# Patient Record
Sex: Female | Born: 1935
Health system: Southern US, Community
[De-identification: ages and names within clinical notes are randomized; demographics above are authoritative.]

## PROBLEM LIST (undated history)

## (undated) DIAGNOSIS — R269 Unspecified abnormalities of gait and mobility: Secondary | ICD-10-CM

## (undated) DIAGNOSIS — F0150 Vascular dementia without behavioral disturbance: Secondary | ICD-10-CM

## (undated) DIAGNOSIS — I639 Cerebral infarction, unspecified: Secondary | ICD-10-CM

## (undated) DIAGNOSIS — I619 Nontraumatic intracerebral hemorrhage, unspecified: Secondary | ICD-10-CM

## (undated) DIAGNOSIS — D110 Benign neoplasm of parotid gland: Secondary | ICD-10-CM

## (undated) DIAGNOSIS — E785 Hyperlipidemia, unspecified: Secondary | ICD-10-CM

## (undated) DIAGNOSIS — I959 Hypotension, unspecified: Secondary | ICD-10-CM

## (undated) DIAGNOSIS — I10 Essential (primary) hypertension: Secondary | ICD-10-CM

## (undated) DIAGNOSIS — Z87440 Personal history of urinary (tract) infections: Secondary | ICD-10-CM

## (undated) DIAGNOSIS — E119 Type 2 diabetes mellitus without complications: Secondary | ICD-10-CM

## (undated) DIAGNOSIS — K829 Disease of gallbladder, unspecified: Secondary | ICD-10-CM

## (undated) DIAGNOSIS — R410 Disorientation, unspecified: Secondary | ICD-10-CM

## (undated) DIAGNOSIS — R413 Other amnesia: Secondary | ICD-10-CM

## (undated) HISTORY — DX: Hyperlipidemia, unspecified: E78.5

## (undated) HISTORY — DX: Other amnesia: R41.3

## (undated) HISTORY — DX: Benign neoplasm of parotid gland: D11.0

## (undated) HISTORY — PX: TONSILLECTOMY: SUR1361

## (undated) HISTORY — DX: Disease of gallbladder, unspecified: K82.9

## (undated) HISTORY — DX: Hypotension, unspecified: I95.9

## (undated) HISTORY — DX: Personal history of urinary (tract) infections: Z87.440

## (undated) HISTORY — DX: Unspecified abnormalities of gait and mobility: R26.9

## (undated) HISTORY — DX: Vascular dementia, unspecified severity, without behavioral disturbance, psychotic disturbance, mood disturbance, and anxiety: F01.50

## (undated) HISTORY — DX: Essential (primary) hypertension: I10

## (undated) HISTORY — DX: Disorientation, unspecified: R41.0

## (undated) HISTORY — DX: Nontraumatic intracerebral hemorrhage, unspecified: I61.9

## (undated) HISTORY — DX: Type 2 diabetes mellitus without complications: E11.9

## (undated) HISTORY — DX: Cerebral infarction, unspecified: I63.9

---

## 1984-08-24 HISTORY — PX: OTHER SURGICAL HISTORY: SHX169

## 2010-08-24 HISTORY — PX: CATARACT EXTRACTION: SUR2

## 2012-03-24 DIAGNOSIS — R220 Localized swelling, mass and lump, head: Secondary | ICD-10-CM | POA: Insufficient documentation

## 2013-10-25 DIAGNOSIS — I619 Nontraumatic intracerebral hemorrhage, unspecified: Secondary | ICD-10-CM | POA: Insufficient documentation

## 2013-10-25 DIAGNOSIS — R413 Other amnesia: Secondary | ICD-10-CM | POA: Insufficient documentation

## 2013-10-25 DIAGNOSIS — F29 Unspecified psychosis not due to a substance or known physiological condition: Secondary | ICD-10-CM | POA: Insufficient documentation

## 2014-03-20 LAB — LIPID PANEL
Cholesterol: 161 mg/dL (ref 0–200)
HDL: 59 mg/dL (ref 35–70)
LDL CALC: 77 mg/dL
Triglycerides: 126 mg/dL (ref 40–160)

## 2014-03-20 LAB — CBC AND DIFFERENTIAL
HEMATOCRIT: 47 % — AB (ref 36–46)
Hemoglobin: 15.6 g/dL (ref 12.0–16.0)
Platelets: 150 10*3/uL (ref 150–399)
WBC: 8.5 10*3/mL

## 2014-03-20 LAB — HEPATIC FUNCTION PANEL
ALT: 15 U/L (ref 7–35)
AST: 15 U/L (ref 13–35)
BILIRUBIN, TOTAL: 0.5 mg/dL

## 2014-03-20 LAB — BASIC METABOLIC PANEL
CREATININE: 0.9 mg/dL (ref 0.5–1.1)
GLUCOSE: 88 mg/dL
Potassium: 4.8 mmol/L (ref 3.4–5.3)
Sodium: 143 mmol/L (ref 137–147)

## 2014-06-28 ENCOUNTER — Ambulatory Visit (INDEPENDENT_AMBULATORY_CARE_PROVIDER_SITE_OTHER): Payer: Medicare HMO | Admitting: Internal Medicine

## 2014-06-28 ENCOUNTER — Encounter: Payer: Self-pay | Admitting: Internal Medicine

## 2014-06-28 VITALS — BP 120/60 | HR 63 | Temp 98.0°F | Ht 63.78 in | Wt 110.2 lb

## 2014-06-28 DIAGNOSIS — E1140 Type 2 diabetes mellitus with diabetic neuropathy, unspecified: Secondary | ICD-10-CM

## 2014-06-28 DIAGNOSIS — Z23 Encounter for immunization: Secondary | ICD-10-CM

## 2014-06-28 DIAGNOSIS — E1143 Type 2 diabetes mellitus with diabetic autonomic (poly)neuropathy: Secondary | ICD-10-CM | POA: Insufficient documentation

## 2014-06-28 DIAGNOSIS — H9190 Unspecified hearing loss, unspecified ear: Secondary | ICD-10-CM | POA: Insufficient documentation

## 2014-06-28 DIAGNOSIS — I69391 Dysphagia following cerebral infarction: Secondary | ICD-10-CM | POA: Insufficient documentation

## 2014-06-28 DIAGNOSIS — F0150 Vascular dementia without behavioral disturbance: Secondary | ICD-10-CM

## 2014-06-28 DIAGNOSIS — E1149 Type 2 diabetes mellitus with other diabetic neurological complication: Secondary | ICD-10-CM | POA: Insufficient documentation

## 2014-06-28 DIAGNOSIS — I951 Orthostatic hypotension: Secondary | ICD-10-CM | POA: Insufficient documentation

## 2014-06-28 DIAGNOSIS — S0452XD Injury of facial nerve, left side, subsequent encounter: Secondary | ICD-10-CM

## 2014-06-28 DIAGNOSIS — K819 Cholecystitis, unspecified: Secondary | ICD-10-CM | POA: Insufficient documentation

## 2014-06-28 DIAGNOSIS — H9193 Unspecified hearing loss, bilateral: Secondary | ICD-10-CM

## 2014-06-28 DIAGNOSIS — E785 Hyperlipidemia, unspecified: Secondary | ICD-10-CM | POA: Insufficient documentation

## 2014-06-28 DIAGNOSIS — R197 Diarrhea, unspecified: Secondary | ICD-10-CM | POA: Insufficient documentation

## 2014-06-28 DIAGNOSIS — S0452XA Injury of facial nerve, left side, initial encounter: Secondary | ICD-10-CM | POA: Insufficient documentation

## 2014-06-28 NOTE — Patient Instructions (Signed)
Please bring [place] a copy of her living will and hcpoa.

## 2014-06-28 NOTE — Progress Notes (Signed)
Patient ID: Erin Hubbard, female   DOB: 03/02/1925, 80 y.o.   MRN: [id number]   Location:  [facility]  Code Status: reviewed that she has living will and hcpoa and will bring copies  Allergies  Allergen Reactions  . Penicillins     Chief Complaint  Patient presents with  . Establish Care    New patient    HPI: Patient is a 80 y.o. female seen in the office today to establish with our practice.    Erin Hubbard herself has no concerns. PCP left [facility].  Her granddaughter was looking for someone who specialized in geriatrics.    Had one stroke sometime before 2003.    Has had memory problems since stroke 2013 when off plavix for parotid tumor removal.  Facial drooping is from removal of the parotid damaging her facial nerve.  Dr. Mamie Hubbard at [facility] had seen her with second stroke.  EEG later.  Had neuropsych testing.  Home health was coming in and out working with PT, OT, ST--advance home care.  Has not had therapy lately.  No more therapy has been ordered yet.    A month after the stroke, she had troubles with her gallbladder--diet got restricted.  Seems like they've found an even diet.  Cheese caused bad diarrhea.  Does skim milk and yogurt.  1/2 [place] sandwich between meals.  Trouble keeping glucose up in 100s.  Using diabetic plate method.  Has been to dietitian also.  Eats very well.  Did lose weight--130-140 at start, now trying to keep her at 100.  Today was 110.    No pain.  No falls.  Uses cane outside.  Less steady on feet for past month.  Has bunions and her shoes are uncomfortable.  Has never had diabetic shoes fitted.  Likes her pretty shoes for church.  Sings in the choir there.  Sister plays the organ and pt plays piano.  Has picked it back up--practices a lot.  Does crossword puzzle in paper daily.    Is HOH.    No hallucinations  Sees Erin Hubbard for ophtho.  Cranberry supplement has been helping prevent recurrent utis--none in a year.   Encouraging fluids also.  Avoiding a lot of high sugar foods.  They were considering taking her off zetia b/c lipids looked good.    Has not been on anything but the 5mg  of aricept.  B/c of her stomach, they avoided adding other agents.  Have some trouble keeping her well hydrated.  Uses the aricept at night.  Does sleep a lot in the daytime.  Goes to bed at 10pm, reads, gets up at 5am.    Was having a swallowing issue at one point.  Had swallowing study.  Some thickening of esophageal wall --thought to be age related per pt's son.  Still has to clear throat a little after eating.    Doesn't get a lot of activity.  More likely to drop when she is active.    Does have some neuropathy in her feet.  Both feet.  Has bunions.  Also has some numbness of left hand and lips.    Review of Systems:  Review of Systems  Constitutional: Negative for fever.  HENT: Positive for hearing loss. Negative for congestion.   Eyes: Negative for blurred vision.  Respiratory: Negative for shortness of breath.   Cardiovascular: Negative for chest pain, palpitations and leg swelling.  Gastrointestinal: Negative for abdominal pain.  Skin: Negative for itching  and rash.  Neurological: Negative for headaches.    Past Medical History  Diagnosis Date  . Hyperlipidemia   . Diabetes mellitus without complication   . Hypotension   . Stroke   . Dementia   . History of recurrent UTI (urinary tract infection)     Past Surgical History  Procedure Laterality Date  . Tonsillectomy      childhood  . Cataract extraction  2012  . Removal partoid tumor  1986    Social History:   reports that she has never smoked. She does not have any smokeless tobacco history on file. She reports that she does not drink alcohol or use illicit drugs.  Family History  Problem Relation Age of Onset  . Heart disease Mother   . Heart disease Father     Medications: Patient's Medications  New Prescriptions   No medications on  file  Previous Medications   ARTIFICIAL TEAR OINTMENT (LUBRICANT EYE) OINT    Apply to eye at bedtime.   CLOPIDOGREL (PLAVIX) 75 MG TABLET    Take 75 mg by mouth daily. 1   CRANBERRY-VIT C-LACTOBACILLUS (RA CRANBERRY SUPPLEMENTS PO)    Take by mouth.   DONEPEZIL (ARICEPT) 5 MG TABLET    Take 5 mg by mouth at bedtime.   EZETIMIBE (ZETIA) 10 MG TABLET    Take 10 mg by mouth daily. 1 tabltet every night at bed time   GLUCOSE BLOOD TEST STRIP    1 each by Other route as needed for other. Use as instructed   PANTOPRAZOLE (PROTONIX) 40 MG TABLET    Take 40 mg by mouth daily. 1 tab by mouth 2 times a day with food   POLYETHYL GLYCOL-PROPYL GLYCOL 0.4-0.3 % SOLN    Apply to eye.  Modified Medications   No medications on file  Discontinued Medications   No medications on file     Physical Exam: Filed Vitals:   06/28/14 0855  BP: 120/60  Pulse: 63  Temp: 98 F (36.7 C)  TempSrc: Oral  Height: 5' 3.78" (1.62 m)  Weight: 110 lb 3.2 oz (49.986 kg)  SpO2: 99%  Physical Exam  Constitutional: She appears well-developed and well-nourished. No distress.  HENT:  Right Ear: External ear normal.  Left Ear: External ear normal.  Nose: Nose normal.  Mouth/Throat: Oropharynx is clear and moist.  Left ear canal with increased cerumen and difficult to navigate canal;  Left facial droop  Eyes: Conjunctivae and EOM are normal. Pupils are equal, round, and reactive to light.  Neck: Normal range of motion. Neck supple. No JVD present. No thyromegaly present.  Cardiovascular: Normal rate, regular rhythm, normal heart sounds and intact distal pulses.   Pulmonary/Chest: Effort normal and breath sounds normal. No respiratory distress.  Abdominal: Soft. Bowel sounds are normal. She exhibits no distension and no mass. There is no tenderness.  Musculoskeletal: Normal range of motion.  Ambulates with cane  Lymphadenopathy:    She has no cervical adenopathy.  Neurological: She is alert. She has normal  reflexes.  Oriented to person and place, not precise time, provides fairly good history with help of her son and granddaughter; left facial droop due to facial nerve damage, drooping includes her left eye  Skin: Skin is warm and dry. There is pallor.  Psychiatric: She has a normal mood and affect. Her behavior is normal.    Labs reviewed: Need to abstract most recent from records from [place]   Assessment/Plan 1. Vascular dementia without behavioral disturbance -  is doing quite well on aricept 5mg  alone--may consider adding namenda or switching to namzaric at a future visit, but they are hesitant due to her sensitive stomach -check mmse at her annual exam next visit  2. Injury of left facial nerve, subsequent encounter -has chronic left facial droop  3. Dysphagia as late effect of stroke -has some difficulty with clearing mucus after meals -previously did have swallow study--need to find in records  4. Hyperlipidemia -cont zetia for now--need an updated FLP  5. Type 2 diabetes mellitus with neurological manifestations, controlled -has been well controlled due to dietary changes, but having some lows -will send to a dietitian to see what advice they have about her snacks -also has gallbladder disease that restricts her diet--addition of cheese caused severe diarrhea  6. Diabetic autonomic neuropathy associated with type 2 diabetes mellitus -seems the most likely etiology--in both feet;  Also has some chronic neuropathic changes on left side due to her first stroke  7. Orthostatic hypotension -previously noted, no recent problems with lightheadedness on standing  8. Gall bladder inflammation -on low fat diet to avoid diarrhea -again, refer to dietitian to help more with this and difficulties she is having with hypoglycemia  9. Diarrhea -due to #8, well controlled when adherent with low fat diet  10. Need for influenza vaccination -flu shot given  Labs/tests ordered:     Orders Placed This Encounter  Procedures  . CBC With differential/Platelet  . Comprehensive metabolic panel  . Hemoglobin A1c  . B12 and Folate Panel  . Ambulatory referral to Podiatry    Referral Priority:  Routine    Referral Type:  Consultation    Referral Reason:  Specialty Services Required    Requested Specialty:  Podiatry    Number of Visits Requested:  1  . Amb ref to Medical Nutrition Therapy-MNT    Referral Priority:  Routine    Referral Type:  Consultation    Referral Reason:  Specialty Services Required    Requested Specialty:  Nutrition    Number of Visits Requested:  1    Next appt:  3 mos for annual exam with mmse and prevnar  Ellamay Fors L. Tatianna Ibbotson, D.O. [facility] [address], [place] [address] Cell Phone (Mon-Fri 8am-5pm):  [phone] On Call:  [phone] & follow prompts after 5pm & weekends Office Phone:  [phone] Office Fax:  [phone]

## 2014-06-29 LAB — CBC WITH DIFFERENTIAL
Basophils Absolute: 0.1 10*3/uL (ref 0.0–0.2)
Basos: 1 %
Eos: 7 %
Eosinophils Absolute: 0.6 10*3/uL — ABNORMAL HIGH (ref 0.0–0.4)
HCT: 46.1 % (ref 34.0–46.6)
Hemoglobin: 15.4 g/dL (ref 11.1–15.9)
Immature Grans (Abs): 0 10*3/uL (ref 0.0–0.1)
Immature Granulocytes: 0 %
Lymphocytes Absolute: 2.2 10*3/uL (ref 0.7–3.1)
Lymphs: 25 %
MCH: 30.6 pg (ref 26.6–33.0)
MCHC: 33.4 g/dL (ref 31.5–35.7)
MCV: 92 fL (ref 79–97)
Monocytes Absolute: 1 10*3/uL — ABNORMAL HIGH (ref 0.1–0.9)
Monocytes: 12 %
Neutrophils Absolute: 4.8 10*3/uL (ref 1.4–7.0)
Neutrophils Relative %: 55 %
Platelets: 179 10*3/uL (ref 150–379)
RBC: 5.04 x10E6/uL (ref 3.77–5.28)
RDW: 12.9 % (ref 12.3–15.4)
WBC: 8.6 10*3/uL (ref 3.4–10.8)

## 2014-06-29 LAB — COMPREHENSIVE METABOLIC PANEL
ALT: 8 IU/L (ref 0–32)
AST: 13 IU/L (ref 0–40)
Albumin/Globulin Ratio: 1.5 (ref 1.1–2.5)
Albumin: 4 g/dL (ref 3.5–4.7)
Alkaline Phosphatase: 77 IU/L (ref 39–117)
BUN/Creatinine Ratio: 36 — ABNORMAL HIGH (ref 11–26)
BUN: 42 mg/dL — ABNORMAL HIGH (ref 8–27)
CO2: 25 mmol/L (ref 18–29)
Calcium: 9 mg/dL (ref 8.7–10.3)
Chloride: 101 mmol/L (ref 97–108)
Creatinine, Ser: 1.16 mg/dL — ABNORMAL HIGH (ref 0.57–1.00)
GFR calc Af Amer: 49 mL/min/{1.73_m2} — ABNORMAL LOW (ref >59)
GFR calc non Af Amer: 43 mL/min/{1.73_m2} — ABNORMAL LOW (ref >59)
Globulin, Total: 2.7 g/dL (ref 1.5–4.5)
Glucose: 85 mg/dL (ref 65–99)
Potassium: 4.5 mmol/L (ref 3.5–5.2)
Sodium: 140 mmol/L (ref 134–144)
Total Bilirubin: 0.5 mg/dL (ref 0.0–1.2)
Total Protein: 6.7 g/dL (ref 6.0–8.5)

## 2014-06-29 LAB — B12 AND FOLATE PANEL
Folate: 17 ng/mL (ref >3.0)
Vitamin B-12: 1302 pg/mL — ABNORMAL HIGH (ref 211–946)

## 2014-06-29 LAB — HEMOGLOBIN A1C
Est. average glucose Bld gHb Est-mCnc: 131 mg/dL
Hgb A1c MFr Bld: 6.2 % — ABNORMAL HIGH (ref 4.8–5.6)

## 2014-07-02 ENCOUNTER — Telehealth: Payer: Self-pay | Admitting: *Deleted

## 2014-07-02 NOTE — Telephone Encounter (Signed)
-----   Message from Gayland Curry, DO sent at 06/30/2014 12:25 PM EST ----- Her sugar average is in the prediabetic range.  At her age, anything under 8 is excellent.  She can eat more liberally in terms of carbs and sugars.  She is restricted by her gallbladder problems in terms of fatty foods causing her diarrhea.   Her B12 and folate are ok.  In fact, she's getting plenty of b12.   Her blood counts are normal.   She has mildly decreased kidney function which is not abnormal in her age group.  She should avoid using nsaids like aleve, motrin.

## 2014-07-02 NOTE — Telephone Encounter (Signed)
Called pt, explained labs and provider instructions, pt. Understood well.

## 2014-08-09 ENCOUNTER — Ambulatory Visit: Payer: Self-pay | Admitting: *Deleted

## 2014-08-14 ENCOUNTER — Encounter: Payer: Self-pay | Admitting: *Deleted

## 2014-08-28 ENCOUNTER — Ambulatory Visit: Payer: Self-pay | Admitting: *Deleted

## 2014-09-24 ENCOUNTER — Encounter: Payer: Commercial Managed Care - HMO | Attending: Internal Medicine | Admitting: Dietician

## 2014-09-24 ENCOUNTER — Encounter: Payer: Self-pay | Admitting: Dietician

## 2014-09-24 VITALS — Ht 64.0 in | Wt 112.0 lb

## 2014-09-24 DIAGNOSIS — E1140 Type 2 diabetes mellitus with diabetic neuropathy, unspecified: Secondary | ICD-10-CM | POA: Diagnosis present

## 2014-09-24 DIAGNOSIS — Z713 Dietary counseling and surveillance: Secondary | ICD-10-CM | POA: Insufficient documentation

## 2014-09-24 DIAGNOSIS — E1149 Type 2 diabetes mellitus with other diabetic neurological complication: Secondary | ICD-10-CM

## 2014-09-24 NOTE — Progress Notes (Signed)
  Medical Nutrition Therapy:  Appt start time: 1130 end time:  1230.   Assessment:  Primary concerns today: Patient is here with her granddaughter who helps care for her.  They are concerned that patient's blood sugar is getting too low.  Patient has a hx of DM with last HgbA1C of 6.2% 06/28/14.  She has been having gall bladder issues as well ann cannot tolerate fats, acidic, or spicy foods.  She lives with her daughter and granddaughter who care for her during the week and spends time with her sister on the weekend.   Meals are very balanced and usually eat 3 meals and 3 snacks daily.  Her diet is low fat, low sodium, low acid and spicy foods, limited sugar.  She does not tolerate sugar substitutes other than stevia.  UBW 130-140 lbs at the time of her stroke 3-4 years ago.  Weight is now 112 lbs but has been stable for the past 1 to 1 1/2 years.  She does not drink enough fluids and has problems maintaining her blood pressure at times.  When she eats fats and spicy foods she gets diarrhea which causes dehydration quickly.    Nutrition Focused physical exam completed which was within normal limits.  Preferred Learning Style:   No preference indicated   Learning Readiness:   Change in progress   MEDICATIONS: see list.  None for blood sugar control   DIETARY INTAKE:  24-hr recall:  B (6:30-6:40AM): 3 egg whites, 1 cup cheerios with skim milk, lite activia yogurt  Snk (9AM): crackers and lite vanilla yogurt or 1/2 LS [place] sandwich  L (12:00 PM): 1/2 plate of vegetables, 3 oz meat, 1/4 plate of starch and fruit Snk (2:30 PM): crackers and lite vanilla yogurt or 1/2 LS [place] sandwich D (5 PM): same as dinner but no fruit Snk (9 PM): lite yogurt Beverages: water, gingerale occasional (mostly to treat low blood sugar), sweet tea occasionally  Usual physical activity: uses a cane.  Hx of a stroke.  Estimated energy needs: 2000 calories 225 g carbohydrates 150 g protein <56 g  fat  Progress Towards Goal(s):  In progress.   Nutritional Diagnosis:  NB-1.1 Food and nutrition-related knowledge deficit As related to treatment and prevention of low blood sugar.  As evidenced by report.    Intervention:  Nutrition counseling.  Teaching Method Utilized:  Visual Auditory Hands on  Handouts given during visit include:  Meal Plan Card  Low CHO/protein snacks  Label reading  Barriers to learning/adherence to lifestyle change: none  Demonstrated degree of understanding via:  Teach Back   Monitoring/Evaluation:  Dietary intake, exercise, label reading, and body weight prn.

## 2014-09-24 NOTE — Patient Instructions (Addendum)
Use the rule of 15 to treat low blood sugars. (give 15 grams carbs then test BS in 15 minutes, if still low repeat) Aim for 3-4 Carb choices at eat meal. (4 choices at breakfast) Add variety with snack choice options provided.

## 2014-09-25 ENCOUNTER — Other Ambulatory Visit: Payer: Self-pay

## 2014-09-25 MED ORDER — PANTOPRAZOLE SODIUM 40 MG PO TBEC: DELAYED_RELEASE_TABLET | ORAL | Status: DC

## 2014-09-25 MED ORDER — GLUCOSE BLOOD VI STRP: ORAL_STRIP | Status: DC

## 2014-09-25 MED ORDER — CLOPIDOGREL BISULFATE 75 MG PO TABS: 75.0000 mg | ORAL_TABLET | Freq: Every day | ORAL | Status: DC

## 2014-10-18 ENCOUNTER — Telehealth: Payer: Self-pay

## 2014-10-18 NOTE — Telephone Encounter (Signed)
Patient with cold symptoms since Monday. Runny nose, cough, congestion and slight fever.  Patient tried OTC medications with no relief. Scheduled appointment for tomorrow at 10:45 am with Dr.Reed

## 2014-10-19 ENCOUNTER — Encounter: Payer: Self-pay | Admitting: Internal Medicine

## 2014-10-19 ENCOUNTER — Ambulatory Visit (INDEPENDENT_AMBULATORY_CARE_PROVIDER_SITE_OTHER): Payer: Medicare HMO | Admitting: Internal Medicine

## 2014-10-19 VITALS — BP 110/60 | HR 56 | Temp 98.2°F | Resp 12 | Ht 64.0 in | Wt 112.6 lb

## 2014-10-19 DIAGNOSIS — J069 Acute upper respiratory infection, unspecified: Secondary | ICD-10-CM

## 2014-10-19 DIAGNOSIS — B9789 Other viral agents as the cause of diseases classified elsewhere: Principal | ICD-10-CM

## 2014-10-19 DIAGNOSIS — S0452XD Injury of facial nerve, left side, subsequent encounter: Secondary | ICD-10-CM | POA: Diagnosis not present

## 2014-10-19 NOTE — Progress Notes (Signed)
Patient ID: Erin Hubbard, female   DOB: [birth year], 81 y.o.   MRN: [id number]   Location:  [facility] / [place]  Advanced Directive information Does patient have an advance directive?: Yes, Type of Advance Directive: Healthcare Power of [place];Living will, Does patient want to make changes to advanced directive?: No - Patient declined   Allergies  Allergen Reactions  . Penicillins     Chief Complaint  Patient presents with  . Acute Visit    Cold symptoms since last weekend. Symptoms: Fever (subsided), congestion, cough, weakness, and bloody nasal drainage    HPI: Patient is a 81 y.o. white female seen in the office today for an acute visit as above.    Still has runny nose.  Has thick mucus.  Some blood in tissues now, trouble getting the mucus up.  Throat had initially been scratchy.  Left eye that does not close well on its own is more irritated.  She is supposed to be putting some ointment in there and has not.    Went to the nutritionist.  Sugars went down, but more carb made her bottom out first thing in the am.  Hard to get enough protein in the morning.  They were making dietary changes the week before the cold started.  She has been out to church to granddaughter's ot and pt.    Review of Systems:  Review of Systems  Constitutional: Positive for fever and chills.  HENT: Positive for congestion, nosebleeds and sore throat. Negative for ear discharge and ear pain.   Eyes: Positive for discharge and redness.       Left eye; has drooping lid  Respiratory: Positive for cough and sputum production. Negative for shortness of breath.   Cardiovascular: Negative for chest pain.  Gastrointestinal: Negative for nausea, vomiting, abdominal pain, diarrhea and constipation.  Musculoskeletal: Negative for falls.  Neurological: Negative for headaches.       Facial droop  Psychiatric/Behavioral: Positive for memory loss.     Past Medical History    Diagnosis Date  . Hyperlipidemia   . Diabetes mellitus without complication   . Hypotension   . Stroke   . Vascular dementia without behavioral disturbance   . History of recurrent UTI (urinary tract infection)   . Benign parotid tumor 1986-1987    s/p resection x 2  . Gallbladder disorder     Past Surgical History  Procedure Laterality Date  . Tonsillectomy      childhood  . Cataract extraction  2012    Dr. Bing Plume  . Removal partoid tumor  1986    Social History:   reports that she has never smoked. She does not have any smokeless tobacco history on file. She reports that she does not drink alcohol or use illicit drugs.  Family History  Problem Relation Age of Onset  . Heart disease Mother   . Heart disease Father   . High blood pressure Sister   . High Cholesterol Sister   . Heart disease Sister   . Polymyositis Daughter   . Diabetes Daughter   . Stroke Other     aunt    Medications: Patient's Medications  New Prescriptions   No medications on file  Previous Medications   ARTIFICIAL TEAR OINTMENT (LUBRICANT EYE) OINT    Apply to eye at bedtime.   CLOPIDOGREL (PLAVIX) 75 MG TABLET    Take 1 tablet (75 mg total) by mouth daily. 1   CRANBERRY-VIT C-LACTOBACILLUS (RA CRANBERRY  SUPPLEMENTS PO)    Take by mouth.   DONEPEZIL (ARICEPT) 5 MG TABLET    Take 5 mg by mouth at bedtime.   EZETIMIBE (ZETIA) 10 MG TABLET    Take 10 mg by mouth daily. 1 tabltet every night at bed time   GLUCOSE BLOOD TEST STRIP    OneTouch Ultra 2 Check blood sugar 3-4 times daily as directed DX E11.40   PANTOPRAZOLE (PROTONIX) 40 MG TABLET    1 tab by mouth 2 times a day with food   POLYETHYL GLYCOL-PROPYL GLYCOL 0.4-0.3 % SOLN    Apply to eye.  Modified Medications   No medications on file  Discontinued Medications   No medications on file     Physical Exam: Filed Vitals:   10/19/14 1030  BP: 110/60  Pulse: 56  Temp: 98.2 F (36.8 C)  TempSrc: Oral  Resp: 12  Height: 5\' 4"  (1.626  m)  Weight: 112 lb 9.6 oz (51.075 kg)  SpO2: 96%  Physical Exam  Constitutional: No distress.  Frail white female  HENT:  Right Ear: External ear normal.  Left Ear: External ear normal.  Nose: Nose normal.  Mouth/Throat: Oropharynx is clear and moist. No oropharyngeal exudate.  Eyes: Pupils are equal, round, and reactive to light.  Left eye droop with some erythema of conjunctiva  Neck: Neck supple.  Cardiovascular: Normal rate, regular rhythm and normal heart sounds.   Pulmonary/Chest: Effort normal and breath sounds normal. No respiratory distress. She has no wheezes. She has no rales.  No rhonchi  Abdominal: Soft. Bowel sounds are normal. She exhibits no distension and no mass. There is no tenderness.  Lymphadenopathy:    She has no cervical adenopathy.  Neurological:  Left facial droop, uses cane  Skin: Skin is warm and dry. There is pallor.    Labs reviewed: Basic Metabolic Panel:  Recent Labs  03/20/14 06/28/14 1043  NA 143 140  K 4.8 4.5  CL  --  101  CO2  --  25  GLUCOSE  --  85  BUN  --  42*  CREATININE 0.9 1.16*  CALCIUM  --  9.0   Liver Function Tests:  Recent Labs  03/20/14 06/28/14 1043  AST 15 13  ALT 15 8  ALKPHOS  --  77  BILITOT  --  0.5  PROT  --  6.7   No results for input(s): LIPASE, AMYLASE in the last 8760 hours. No results for input(s): AMMONIA in the last 8760 hours. CBC:  Recent Labs  03/20/14 06/28/14 1043  WBC 8.5 8.6  NEUTROABS  --  4.8  HGB 15.6 15.4  HCT 47* 46.1  MCV  --  92  PLT 150 179   Lipid Panel:  Recent Labs  03/20/14  CHOL 161  HDL 59  LDLCALC 77  TRIG 126   Lab Results  Component Value Date   HGBA1C 6.2* 06/28/2014    Assessment/Plan 1. Viral upper respiratory tract infection with cough -given samples for mucinex 12 hour to be used once each morning (expectorant) -given samples for delsym cough syrup for night time to suppress cough overnight -advised to drink plenty of water and get a lot of  rest -use humidity   2. Injury of left facial nerve, subsequent encounter -encouraged to use ointment and eye drops to help   Labs/tests ordered:  None today Next appt:  Keep in April as scheduled  Erin Hubbard L. Johnryan Sao, D.O. [facility] [phone]  Erin Hubbard, Erin Hubbard [address] Cell Phone (Mon-Fri 8am-5pm):  [phone] On Call:  [phone] & follow prompts after 5pm & weekends Office Phone:  [phone] Office Fax:  [phone]

## 2014-10-19 NOTE — Patient Instructions (Addendum)
Delsym at night Mucinex in the morning Drink plenty of fluids and use cool mist humidity  Upper Respiratory Infection, Adult An upper respiratory infection (URI) is also sometimes known as the common cold. The upper respiratory tract includes the nose, sinuses, throat, trachea, and bronchi. Bronchi are the airways leading to the lungs. Most people improve within 1 week, but symptoms can last up to 2 weeks. A residual cough may last even longer.  CAUSES Many different viruses can infect the tissues lining the upper respiratory tract. The tissues become irritated and inflamed and often become very moist. Mucus production is also common. A cold is contagious. You can easily spread the virus to others by oral contact. This includes kissing, sharing a glass, coughing, or sneezing. Touching your mouth or nose and then touching a surface, which is then touched by another person, can also spread the virus. SYMPTOMS  Symptoms typically develop 1 to 3 days after you come in contact with a cold virus. Symptoms vary from person to person. They may include:  Runny nose.  Sneezing.  Nasal congestion.  Sinus irritation.  Sore throat.  Loss of voice (laryngitis).  Cough.  Fatigue.  Muscle aches.  Loss of appetite.  Headache.  Low-grade fever. DIAGNOSIS  You might diagnose your own cold based on familiar symptoms, since most people get a cold 2 to 3 times a year. Your caregiver can confirm this based on your exam. Most importantly, your caregiver can check that your symptoms are not due to another disease such as strep throat, sinusitis, pneumonia, asthma, or epiglottitis. Blood tests, throat tests, and X-rays are not necessary to diagnose a common cold, but they may sometimes be helpful in excluding other more serious diseases. Your caregiver will decide if any further tests are required. RISKS AND COMPLICATIONS  You may be at risk for a more severe case of the common cold if you smoke  cigarettes, have chronic heart disease (such as heart failure) or lung disease (such as asthma), or if you have a weakened immune system. The very young and very old are also at risk for more serious infections. Bacterial sinusitis, middle ear infections, and bacterial pneumonia can complicate the common cold. The common cold can worsen asthma and chronic obstructive pulmonary disease (COPD). Sometimes, these complications can require emergency medical care and may be life-threatening. PREVENTION  The best way to protect against getting a cold is to practice good hygiene. Avoid oral or hand contact with people with cold symptoms. Wash your hands often if contact occurs. There is no clear evidence that vitamin C, vitamin E, echinacea, or exercise reduces the chance of developing a cold. However, it is always recommended to get plenty of rest and practice good nutrition. TREATMENT  Treatment is directed at relieving symptoms. There is no cure. Antibiotics are not effective, because the infection is caused by a virus, not by bacteria. Treatment may include:  Increased fluid intake. Sports drinks offer valuable electrolytes, sugars, and fluids.  Breathing heated mist or steam (vaporizer or shower).  Eating chicken soup or other clear broths, and maintaining good nutrition.  Getting plenty of rest.  Using gargles or lozenges for comfort.  Controlling fevers with ibuprofen or acetaminophen as directed by your caregiver.  Increasing usage of your inhaler if you have asthma. Zinc gel and zinc lozenges, taken in the first 24 hours of the common cold, can shorten the duration and lessen the severity of symptoms. Pain medicines may help with fever, muscle aches, and  throat pain. A variety of non-prescription medicines are available to treat congestion and runny nose. Your caregiver can make recommendations and may suggest nasal or lung inhalers for other symptoms.  HOME CARE INSTRUCTIONS   Only take  over-the-counter or prescription medicines for pain, discomfort, or fever as directed by your caregiver.  Use a warm mist humidifier or inhale steam from a shower to increase air moisture. This may keep secretions moist and make it easier to breathe.  Drink enough water and fluids to keep your urine clear or pale yellow.  Rest as needed.  Return to work when your temperature has returned to normal or as your caregiver advises. You may need to stay home longer to avoid infecting others. You can also use a face mask and careful hand washing to prevent spread of the virus. SEEK MEDICAL CARE IF:   After the first few days, you feel you are getting worse rather than better.  You need your caregiver's advice about medicines to control symptoms.  You develop chills, worsening shortness of breath, or brown or red sputum. These may be signs of pneumonia.  You develop yellow or brown nasal discharge or pain in the face, especially when you bend forward. These may be signs of sinusitis.  You develop a fever, swollen neck glands, pain with swallowing, or white areas in the back of your throat. These may be signs of strep throat. SEEK IMMEDIATE MEDICAL CARE IF:   You have a fever.  You develop severe or persistent headache, ear pain, sinus pain, or chest pain.  You develop wheezing, a prolonged cough, cough up blood, or have a change in your usual mucus (if you have chronic lung disease).  You develop sore muscles or a stiff neck. Document Released: 02/03/2001 Document Revised: 11/02/2011 Document Reviewed: 11/15/2013 [facility] Patient Information 2015 [place], [place]. This information is not intended to replace advice given to you by your health care provider. Make sure you discuss any questions you have with your health care provider.

## 2014-10-24 ENCOUNTER — Encounter: Payer: Self-pay | Admitting: *Deleted

## 2014-11-30 ENCOUNTER — Ambulatory Visit (INDEPENDENT_AMBULATORY_CARE_PROVIDER_SITE_OTHER): Payer: Commercial Managed Care - HMO | Admitting: Internal Medicine

## 2014-11-30 ENCOUNTER — Encounter: Payer: Self-pay | Admitting: Internal Medicine

## 2014-11-30 VITALS — BP 154/58 | HR 57 | Temp 97.4°F | Resp 20 | Ht 64.0 in | Wt 115.4 lb

## 2014-11-30 DIAGNOSIS — Z780 Asymptomatic menopausal state: Secondary | ICD-10-CM

## 2014-11-30 DIAGNOSIS — E785 Hyperlipidemia, unspecified: Secondary | ICD-10-CM | POA: Diagnosis not present

## 2014-11-30 DIAGNOSIS — I951 Orthostatic hypotension: Secondary | ICD-10-CM

## 2014-11-30 DIAGNOSIS — Z0000 Encounter for general adult medical examination without abnormal findings: Secondary | ICD-10-CM

## 2014-11-30 DIAGNOSIS — I10 Essential (primary) hypertension: Secondary | ICD-10-CM

## 2014-11-30 DIAGNOSIS — E1140 Type 2 diabetes mellitus with diabetic neuropathy, unspecified: Secondary | ICD-10-CM | POA: Diagnosis not present

## 2014-11-30 DIAGNOSIS — Z23 Encounter for immunization: Secondary | ICD-10-CM | POA: Diagnosis not present

## 2014-11-30 DIAGNOSIS — F0150 Vascular dementia without behavioral disturbance: Secondary | ICD-10-CM | POA: Diagnosis not present

## 2014-11-30 DIAGNOSIS — E1149 Type 2 diabetes mellitus with other diabetic neurological complication: Secondary | ICD-10-CM

## 2014-11-30 DIAGNOSIS — I69391 Dysphagia following cerebral infarction: Secondary | ICD-10-CM

## 2014-11-30 NOTE — Progress Notes (Signed)
Passed clock drawing 

## 2014-11-30 NOTE — Patient Instructions (Signed)
Check blood pressure alternating morning and evening every other day and record.  If staying over 383 systolic consistently, we may need to resume a low dose of bp medication.

## 2014-11-30 NOTE — Progress Notes (Signed)
Patient ID: Erin Hubbard, female   DOB: [birth year], 81 y.o.   MRN: [id number]   Location:  [facility]  Goals of Care: Advanced Directive information Does patient have an advance directive?: Yes, Type of Advance Directive: Living will;Healthcare Power of Attorney, Does patient want to make changes to advanced directive?: No - Patient declined   Allergies  Allergen Reactions  . Penicillins     Chief Complaint  Patient presents with  . Annual Exam    Yearly exam    HPI: Patient is a 81 y.o. white female seen in the office today for her annual exam.    Started eating every 2 hours since going to the nutritionist.  Really helping blood sugar.  If delayed, drops quickly.   BP has gone back up.  Had been low all of the time--have not been checking at home.  Has had more sodium with crackers, etc.  No longer on bp meds, but used to.  Usually drops more when she's been up and about.  Discussed checking bp again at home.    Sees Dr. Bing Plume for eyes.  Lubricating drop in day and gel at night.    Has not yet been to podiatry.  Should have diabetic shoes.    Her granddaughter plans to check with neurology about adding namenda as I recommend.  05/11/14 MMSE at [facility] was 29/30.  MMSE done today:  MMSE - Mini Mental State Exam 11/30/2014  Orientation to time 2  Orientation to Place 5  Registration 3  Attention/ Calculation 5  Recall 2  Language- name 2 objects 2  Language- repeat 1  Language- follow 3 step command 3  Language- read & follow direction 1  Write a sentence 1  Copy design 1  Total score 26   She is not depressed.  She has not fallen.  She is at risk of falling due to her prior stroke and use of cane, as well as neuropathy.    They agree to a bone density test which has not been done upon review of her records from [facility] and [facility] neurology.  Decline pelvic/pap/cscope/mammo due to her age and debility.  Agreed  to prevnar.  Has had pneumovax.  Still needs tdap and zostavax at pharmacy--will give Rxs at next appt.  Review of Systems:  Review of Systems  Constitutional: Negative for fever, chills and malaise/fatigue.  HENT: Positive for hearing loss.   Eyes:       Left eye droop from parotid surgery  Respiratory: Negative for shortness of breath.   Cardiovascular: Negative for chest pain and palpitations.  Gastrointestinal: Negative for abdominal pain, constipation, blood in stool and melena.  Genitourinary: Negative for dysuria.  Musculoskeletal: Negative for myalgias and falls.  Skin: Negative for rash.  Neurological: Positive for weakness. Negative for dizziness and loss of consciousness.  Endo/Heme/Allergies: Bruises/bleeds easily.  Psychiatric/Behavioral: Positive for memory loss. Negative for depression. The patient is not nervous/anxious and does not have insomnia.      Past Medical History  Diagnosis Date  . Hyperlipidemia   . Diabetes mellitus without complication   . Hypotension   . Stroke   . Vascular dementia without behavioral disturbance   . History of recurrent UTI (urinary tract infection)   . Benign parotid tumor 1986-1987    s/p resection x 2  . Gallbladder disorder   . Memory loss   . Intracerebral hemorrhage   . Gait disturbance   .  Confusion     04/19/14:EEG showed abnormal with mild to moderate slow wave over the left frontotemporal area and mild slow wave abnormality in the right temporal region without sz activity     Past Surgical History  Procedure Laterality Date  . Tonsillectomy      childhood  . Cataract extraction  2012    Dr. Bing Plume  . Removal partoid tumor  1986    Social History:   reports that she has never smoked. She does not have any smokeless tobacco history on file. She reports that she does not drink alcohol or use illicit drugs.  Family History  Problem Relation Age of Onset  . Heart disease Mother   . Heart disease Father   . High  blood pressure Sister   . High Cholesterol Sister   . Heart disease Sister   . Polymyositis Daughter   . Diabetes Daughter   . Stroke Other     aunt    Medications: Patient's Medications  New Prescriptions   No medications on file  Previous Medications   ARTIFICIAL TEAR OINTMENT (LUBRICANT EYE) OINT    Apply to eye at bedtime.   CLOPIDOGREL (PLAVIX) 75 MG TABLET    Take 1 tablet (75 mg total) by mouth daily. 1   DONEPEZIL (ARICEPT) 5 MG TABLET    Take 5 mg by mouth at bedtime.   EZETIMIBE (ZETIA) 10 MG TABLET    Take 10 mg by mouth daily. 1 tabltet every night at bed time   GLUCOSE BLOOD TEST STRIP    OneTouch Ultra 2 Check blood sugar 3-4 times daily as directed DX E11.40   PANTOPRAZOLE (PROTONIX) 40 MG TABLET    1 tab by mouth 2 times a day with food   POLYETHYL GLYCOL-PROPYL GLYCOL 0.4-0.3 % SOLN    Apply to eye.  Modified Medications   No medications on file  Discontinued Medications   CRANBERRY-VIT C-LACTOBACILLUS (RA CRANBERRY SUPPLEMENTS PO)    Take by mouth.     Physical Exam: Filed Vitals:   11/30/14 0853  BP: 154/58  Pulse: 57  Temp: 97.4 F (36.3 C)  TempSrc: Oral  Resp: 20  Height: 5\' 4"  (1.626 m)  Weight: 115 lb 6.4 oz (52.345 kg)  SpO2: 97%  Physical Exam  Constitutional: She is oriented to person, place, and time.  HENT:  Right Ear: External ear normal.  Left Ear: External ear normal.  Nose: Nose normal.  Mouth/Throat: Oropharynx is clear and moist.  Eyes: Conjunctivae are normal. Pupils are equal, round, and reactive to light.  Left eye droop  And facial droop  Neck: Neck supple.  Cardiovascular: Normal rate, regular rhythm, normal heart sounds and intact distal pulses.   Pulmonary/Chest: Effort normal and breath sounds normal. No respiratory distress.  Abdominal: Bowel sounds are normal.  Musculoskeletal: Normal range of motion.  Walks with cane in her hand not really using it for support  Neurological: She is alert and oriented to person,  place, and time. No cranial nerve deficit. She exhibits normal muscle tone.  Skin: Skin is warm and dry. There is pallor.  Psychiatric: She has a normal mood and affect.    Labs reviewed: Basic Metabolic Panel:  Recent Labs  03/20/14 06/28/14 1043  NA 143 140  K 4.8 4.5  CL  --  101  CO2  --  25  GLUCOSE  --  85  BUN  --  42*  CREATININE 0.9 1.16*  CALCIUM  --  9.0  Liver Function Tests:  Recent Labs  03/20/14 06/28/14 1043  AST 15 13  ALT 15 8  ALKPHOS  --  77  BILITOT  --  0.5  PROT  --  6.7   No results for input(s): LIPASE, AMYLASE in the last 8760 hours. No results for input(s): AMMONIA in the last 8760 hours. CBC:  Recent Labs  03/20/14 06/28/14 1043  WBC 8.5 8.6  NEUTROABS  --  4.8  HGB 15.6 15.4  HCT 47* 46.1  MCV  --  92  PLT 150 179   Lipid Panel:  Recent Labs  03/20/14  CHOL 161  HDL 59  LDLCALC 77  TRIG 126   Lab Results  Component Value Date   HGBA1C 6.2* 06/28/2014     Assessment/Plan 1. Type 2 diabetes mellitus with neurological manifestations, controlled - cont current dietary changes and f/u labs with hba1c and microalbumin - Ambulatory referral to Podiatry - Ambulatory referral to Ophthalmology - Hemoglobin A1c - Microalbumin/Creatinine Ratio, Urine  2. Vascular dementia without behavioral disturbance -MMSE has declined from 19 to 26, passed clock -recommend adding namenda to her aricept--her granddaughter Joellen Jersey plans to ask neurology about this  3. Dysphagia as late effect of stroke -doing well with this recently   4. Hyperlipidemia -continues on zetia for this, last lipids 7/15 were abstracted from her prior pcp  5. Orthostatic hypotension -no recent episodes with dietary changes  -need to tolerate higher bp  6. Essential hypertension, benign -had been taken off bp meds when on stricter low sodium diet, but has now been eating some saltier snacks and bps may be elevated--Katie will check at home, but was above  goal here (I will set goal at <150/90 due to her orthostic hypotension history)  7. Need for vaccination with 13-polyvalent pneumococcal conjugate vaccine - prevnar given - Pneumococcal conjugate vaccine 13-valent  8. Postmenopausal estrogen deficiency - has ever had bone density on records -also not on ca or vitamin D--may add vitamin D regardless next time - DG Bone Density; Future  9.  Primary General medical exam:  See hpi for details on preventive care  Labs/tests ordered:   Orders Placed This Encounter  Procedures  . DG Bone Density    Standing Status: Future     Number of Occurrences:      Standing Expiration Date: 01/30/2016    Order Specific Question:  Reason for Exam (SYMPTOM  OR DIAGNOSIS REQUIRED)    Answer:  estrogen deficiency, fall risk    Order Specific Question:  Preferred imaging location?    Answer:  [facility]  . Pneumococcal conjugate vaccine 13-valent  . Hemoglobin A1c  . Microalbumin/Creatinine Ratio, Urine  . Ambulatory referral to Podiatry    Referral Priority:  Routine    Referral Type:  Consultation    Referral Reason:  Specialty Services Required    Requested Specialty:  Podiatry    Number of Visits Requested:  1  . Ambulatory referral to Ophthalmology    Referral Priority:  Routine    Referral Type:  Consultation    Referral Reason:  Specialty Services Required    Requested Specialty:  Ophthalmology    Number of Visits Requested:  1    Next appt:   Keep routine appt in June  Ever Gustafson L. Lido Maske, D.O. [facility] [address],  [address] Cell Phone (Mon-Fri 8am-5pm):  [phone] On Call:  [phone] & follow prompts after 5pm & weekends Office Phone:  [phone] Office Fax:  [phone]

## 2014-12-01 LAB — MICROALBUMIN / CREATININE URINE RATIO
Creatinine, Urine: 51.3 mg/dL (ref 15.0–278.0)
MICROALB/CREAT RATIO: <5.8 mg/g creat (ref 0.0–30.0)
Microalbumin, Urine: <3 ug/mL (ref 0.0–17.0)

## 2014-12-01 LAB — HEMOGLOBIN A1C
Est. average glucose Bld gHb Est-mCnc: 131 mg/dL
Hgb A1c MFr Bld: 6.2 % — ABNORMAL HIGH (ref 4.8–5.6)

## 2014-12-19 ENCOUNTER — Ambulatory Visit: Payer: Commercial Managed Care - HMO | Admitting: Podiatry

## 2014-12-24 ENCOUNTER — Encounter: Payer: Self-pay | Admitting: Podiatry

## 2014-12-24 ENCOUNTER — Ambulatory Visit (INDEPENDENT_AMBULATORY_CARE_PROVIDER_SITE_OTHER): Payer: Commercial Managed Care - HMO | Admitting: Podiatry

## 2014-12-24 VITALS — BP 151/64 | HR 48 | Resp 12

## 2014-12-24 DIAGNOSIS — M79676 Pain in unspecified toe(s): Secondary | ICD-10-CM

## 2014-12-24 DIAGNOSIS — B351 Tinea unguium: Secondary | ICD-10-CM | POA: Diagnosis not present

## 2014-12-24 NOTE — Patient Instructions (Signed)
Diabetes and Foot Care Diabetes may cause you to have problems because of poor blood supply (circulation) to your feet and legs. This may cause the skin on your feet to become thinner, break easier, and heal more slowly. Your skin may become dry, and the skin may peel and crack. You may also have nerve damage in your legs and feet causing decreased feeling in them. You may not notice minor injuries to your feet that could lead to infections or more serious problems. Taking care of your feet is one of the most important things you can do for yourself.  HOME CARE INSTRUCTIONS  Wear shoes at all times, even in the house. Do not go barefoot. Bare feet are easily injured.  Check your feet daily for blisters, cuts, and redness. If you cannot see the bottom of your feet, use a mirror or ask someone for help.  Wash your feet with warm water (do not use hot water) and mild soap. Then pat your feet and the areas between your toes until they are completely dry. Do not soak your feet as this can dry your skin.  Apply a moisturizing lotion or petroleum jelly (that does not contain alcohol and is unscented) to the skin on your feet and to dry, brittle toenails. Do not apply lotion between your toes.  Trim your toenails straight across. Do not dig under them or around the cuticle. File the edges of your nails with an emery board or nail file.  Do not cut corns or calluses or try to remove them with medicine.  Wear clean socks or stockings every day. Make sure they are not too tight. Do not wear knee-high stockings since they may decrease blood flow to your legs.  Wear shoes that fit properly and have enough cushioning. To break in new shoes, wear them for just a few hours a day. This prevents you from injuring your feet. Always look in your shoes before you put them on to be sure there are no objects inside.  Do not cross your legs. This may decrease the blood flow to your feet.  If you find a minor scrape,  cut, or break in the skin on your feet, keep it and the skin around it clean and dry. These areas may be cleansed with mild soap and water. Do not cleanse the area with peroxide, alcohol, or iodine.  When you remove an adhesive bandage, be sure not to damage the skin around it.  If you have a wound, look at it several times a day to make sure it is healing.  Do not use heating pads or hot water bottles. They may burn your skin. If you have lost feeling in your feet or legs, you may not know it is happening until it is too late.  Make sure your health care provider performs a complete foot exam at least annually or more often if you have foot problems. Report any cuts, sores, or bruises to your health care provider immediately. SEEK MEDICAL CARE IF:   You have an injury that is not healing.  You have cuts or breaks in the skin.  You have an ingrown nail.  You notice redness on your legs or feet.  You feel burning or tingling in your legs or feet.  You have pain or cramps in your legs and feet.  Your legs or feet are numb.  Your feet always feel cold. SEEK IMMEDIATE MEDICAL CARE IF:   There is increasing redness,   swelling, or pain in or around a wound.  There is a red line that goes up your leg.  Pus is coming from a wound.  You develop a fever or as directed by your health care provider.  You notice a bad smell coming from an ulcer or wound. Document Released: 08/07/2000 Document Revised: 04/12/2013 Document Reviewed: 01/17/2013 [employer] Patient Information 2015 [employer]. This information is not intended to replace advice given to you by your health care provider. Make sure you discuss any questions you have with your health care provider.  

## 2014-12-24 NOTE — Progress Notes (Signed)
   Subjective:    Patient ID: Erin Hubbard, female    DOB: 1924-11-01, 81 y.o.   MRN: [id number]  HPI  N-SORE L-B/L TOENAILS D-LONG TERM O-SLOWLY C-WORSE A-PRESSURE T-TRIM  ALSO, PT HAVE B/L BUNION.  She denies any history of foot ulceration or claudication  Review of Systems  HENT: Positive for hearing loss.   Gastrointestinal: Positive for constipation.  Endocrine: Positive for cold intolerance.  Musculoskeletal: Positive for gait problem.  All other systems reviewed and are negative.      Objective:   Physical Exam  Patient responsive and orientated 3 with her granddaughter Erin Hubbard present to treatment room  Vascular: DP and PT pulses 2/4 bilaterally Capillary reflex immediate bilaterally  Neurological: Sensation to 10 g monofilament wire intact 5/5 bilaterally Ankle reflex equal and reactive bilaterally Vibratory sensation intact laterally  Dermatological: No skin lesions noted bilaterally The toenails are elongated, brittle, incurvated, discolored and tender direct palpation 6-10  Musculoskeletal: Unsteady gait pattern requiring patient to use cane HAV deformities bilaterally    Assessment & Plan:   Assessment: Satisfactory neurovascular status Symptomatic onychomycoses 6-10 HAV deformities bilaterally Gait disturbance  Plan: Debridement toenails 10 without any bleeding  Reappoint 3 months for toenail debridement

## 2014-12-25 ENCOUNTER — Encounter: Payer: Self-pay | Admitting: Podiatry

## 2015-01-31 ENCOUNTER — Inpatient Hospital Stay: Admission: RE | Admit: 2015-01-31 | Payer: Commercial Managed Care - HMO | Source: Ambulatory Visit

## 2015-02-18 ENCOUNTER — Ambulatory Visit: Payer: Self-pay | Admitting: Internal Medicine

## 2015-02-18 ENCOUNTER — Encounter: Payer: Self-pay | Admitting: Internal Medicine

## 2015-02-18 ENCOUNTER — Ambulatory Visit (INDEPENDENT_AMBULATORY_CARE_PROVIDER_SITE_OTHER): Payer: Commercial Managed Care - HMO | Admitting: Internal Medicine

## 2015-02-18 VITALS — BP 110/54 | HR 51 | Temp 98.7°F | Resp 18 | Ht 64.0 in | Wt 118.8 lb

## 2015-02-18 DIAGNOSIS — I69391 Dysphagia following cerebral infarction: Secondary | ICD-10-CM | POA: Diagnosis not present

## 2015-02-18 DIAGNOSIS — I10 Essential (primary) hypertension: Secondary | ICD-10-CM

## 2015-02-18 DIAGNOSIS — Z780 Asymptomatic menopausal state: Secondary | ICD-10-CM | POA: Diagnosis not present

## 2015-02-18 DIAGNOSIS — E785 Hyperlipidemia, unspecified: Secondary | ICD-10-CM | POA: Diagnosis not present

## 2015-02-18 DIAGNOSIS — E1149 Type 2 diabetes mellitus with other diabetic neurological complication: Secondary | ICD-10-CM

## 2015-02-18 DIAGNOSIS — E1140 Type 2 diabetes mellitus with diabetic neuropathy, unspecified: Secondary | ICD-10-CM | POA: Diagnosis not present

## 2015-02-18 DIAGNOSIS — F0150 Vascular dementia without behavioral disturbance: Secondary | ICD-10-CM | POA: Diagnosis not present

## 2015-02-18 NOTE — Progress Notes (Signed)
Patient ID: Erin Hubbard, female   DOB: [birth year], 81 y.o.   MRN: [id number]   Location:  [address]  Code Status: DNR Goals of Care: Advanced Directive information Does patient have an advance directive?: Yes, Type of Advance Directive: [place];Living will;Out of facility DNR (pink MOST or yellow form), Pre-existing out of facility DNR order (yellow form or pink MOST form): Yellow form placed in chart (order not valid for inpatient use), Does patient want to make changes to advanced directive?: No - Patient declined   Chief Complaint  Patient presents with  . Medical Management of Chronic Issues    HPI: Patient is a 81 y.o. white female seen in the office today for med mgt of chronic diseases.  She is notably chronically bradycardic. Gained some weight since eating every couple of hours. Podiatry visit did not go well.  Prefers to see her pedicurist instead.  Did not qualify for diabetic shoes.   No spells of dizziness.   Swallowing pretty good.   No diarrhea either.   [facility] home health coming out from [place] working on problem solving and memory.  One second recert at present.  Was ordered by neurology instead. Sings in choir at church each Sunday and plays piano, sister does organ and they do duets.   Sugars are doing well.  Rarely in 70s if got stuck somewhere and couldn't eat.  Trying to keep it consistent on the weekends when her sister is with her.   BP has been good except once when speech therapist has come out and checked.  Had been asymptomatic before with her orthostatic hypotension.    Review of Systems:  Review of Systems  Constitutional: Negative for fever, chills and weight loss.       Gained some weight  HENT: Negative for congestion.   Eyes: Positive for blurred vision.       Left eye droop with facial droop  Respiratory: Negative for shortness of breath.   Cardiovascular: Negative for chest pain.    Gastrointestinal: Negative for abdominal pain, blood in stool and melena.  Genitourinary: Negative for dysuria, urgency and frequency.  Musculoskeletal: Negative for falls.  Neurological: Positive for sensory change. Negative for weakness and headaches.  Psychiatric/Behavioral: Positive for memory loss. Negative for depression.    Past Medical History  Diagnosis Date  . Hyperlipidemia   . Diabetes mellitus without complication   . Hypotension   . Stroke   . Vascular dementia without behavioral disturbance   . History of recurrent UTI (urinary tract infection)   . Benign parotid tumor 1986-1987    s/p resection x 2  . Gallbladder disorder   . Memory loss   . Intracerebral hemorrhage   . Gait disturbance   . Confusion     04/19/14:EEG showed abnormal with mild to moderate slow wave over the left frontotemporal area and mild slow wave abnormality in the right temporal region without sz activity     Past Surgical History  Procedure Laterality Date  . Tonsillectomy      childhood  . Cataract extraction  2012    Dr. Bing Plume  . Removal partoid tumor  1986    Allergies  Allergen Reactions  . Penicillins    Medications: Patient's Medications  New Prescriptions   No medications on file  Previous Medications   ARTIFICIAL TEAR OINTMENT (LUBRICANT EYE) OINT    Apply to eye at bedtime.   CLOPIDOGREL (PLAVIX) 75 MG TABLET  Take 1 tablet (75 mg total) by mouth daily. 1   DONEPEZIL (ARICEPT) 5 MG TABLET    Take 5 mg by mouth at bedtime.   EZETIMIBE (ZETIA) 10 MG TABLET    Take 10 mg by mouth daily. 1 tabltet every night at bed time   GLUCOSE BLOOD TEST STRIP    OneTouch Ultra 2 Check blood sugar 3-4 times daily as directed DX E11.40   PANTOPRAZOLE (PROTONIX) 40 MG TABLET    1 tab by mouth 2 times a day with food   POLYETHYL GLYCOL-PROPYL GLYCOL 0.4-0.3 % SOLN    Apply to eye.  Modified Medications   No medications on file  Discontinued Medications   CLOPIDOGREL (PLAVIX) 75 MG  TABLET    TAKE 1 TABLET BY MOUTH EVERY DAY   DONEPEZIL (ARICEPT) 5 MG TABLET    TAKE 1 TABLET BY MOUTH EVERY DAY    Physical Exam: Filed Vitals:   02/18/15 1548  BP: 110/54  Pulse: 51  Temp: 98.7 F (37.1 C)  TempSrc: Oral  Resp: 18  Height: 5\' 4"  (1.626 m)  Weight: 118 lb 12.8 oz (53.887 kg)  SpO2: 96%   Physical Exam  Constitutional: No distress.  Thin white female  HENT:  Head: Normocephalic and atraumatic.  Eyes: Pupils are equal, round, and reactive to light.  Left eye droop  Neck: Neck supple.  Cardiovascular: Normal rate, regular rhythm and normal heart sounds.   Pulmonary/Chest: Effort normal and breath sounds normal.  Neurological: She is alert. A cranial nerve deficit is present.  Skin: Skin is warm and dry.    Labs reviewed: Basic Metabolic Panel:  Recent Labs  03/20/14 06/28/14 1043  NA 143 140  K 4.8 4.5  CL  --  101  CO2  --  25  GLUCOSE  --  85  BUN  --  42*  CREATININE 0.9 1.16*  CALCIUM  --  9.0   Liver Function Tests:  Recent Labs  03/20/14 06/28/14 1043  AST 15 13  ALT 15 8  ALKPHOS  --  77  BILITOT  --  0.5  PROT  --  6.7   No results for input(s): LIPASE, AMYLASE in the last 8760 hours. No results for input(s): AMMONIA in the last 8760 hours. CBC:  Recent Labs  03/20/14 06/28/14 1043  WBC 8.5 8.6  NEUTROABS  --  4.8  HGB 15.6 15.4  HCT 47* 46.1  MCV  --  92  PLT 150 179   Lipid Panel:  Recent Labs  03/20/14  CHOL 161  HDL 59  LDLCALC 77  TRIG 126   Lab Results  Component Value Date   HGBA1C 6.2* 11/30/2014   Assessment/Plan 1. Type 2 diabetes mellitus with neurological manifestations, controlled -hba1c under good control, eating more regular small meals with improvement in weight with maintenance of good cbgs readings  2. Dysphagia as late effect of stroke -has been stable, no changes  3. Vascular dementia without behavioral disturbance -does very well with living with her granddaughter and then her  sister on weekends  4. Essential hypertension, benign -bp at goal with current therapy; has h/o orthostatis and bradycardia so monitor carefully and do not aggressively titrate meds  5. Hyperlipidemia -cont zetia  6. Postmenopausal estrogen deficiency -referred for bone density previously, but somehow it didn't ever happen so I gave them the # for the breast center to make an appt  Next appt:  3 mos  Laurena Valko L. Quirino Kakos, D.O. Geriatric[facility] [employer]  [facility] [address], [place] [address] Cell Phone (Mon-Fri 8am-5pm):  [phone] On Call:  [phone] & follow prompts after 5pm & weekends Office Phone:  [phone] Office Fax:  [phone]

## 2015-02-18 NOTE — Patient Instructions (Signed)
Please call to schedule bone density.

## 2015-03-25 ENCOUNTER — Ambulatory Visit: Payer: Commercial Managed Care - HMO | Admitting: Podiatry

## 2015-04-02 ENCOUNTER — Encounter: Payer: Self-pay | Admitting: Nurse Practitioner

## 2015-04-02 ENCOUNTER — Ambulatory Visit (INDEPENDENT_AMBULATORY_CARE_PROVIDER_SITE_OTHER): Payer: Commercial Managed Care - HMO | Admitting: Nurse Practitioner

## 2015-04-02 VITALS — BP 138/60 | HR 59 | Temp 97.9°F | Resp 18 | Ht 64.0 in | Wt 117.8 lb

## 2015-04-02 DIAGNOSIS — R040 Epistaxis: Secondary | ICD-10-CM | POA: Diagnosis not present

## 2015-04-02 DIAGNOSIS — R1111 Vomiting without nausea: Secondary | ICD-10-CM | POA: Diagnosis not present

## 2015-04-02 NOTE — Progress Notes (Signed)
Patient ID: Erin Hubbard, female   DOB: 02-07-1925, 81 y.o.   MRN: [id number]    PCP: Hollace Kinnier, DO  Allergies  Allergen Reactions  . Penicillins     Chief Complaint  Patient presents with  . Acute Visit     Patient c/o vomiting, nose bleed    HPI: Patient is a 81 y.o. female seen in the office today due to nose bleeds and being sick.  Over the weekend she was with her sister who changed her diet and she threw up 4-5 times, which started her nose bleeds. Denies nausea just felt uneasy. Had not check blood sugars when she was having these episodes. No sick contacts. Has not had vomiting episode for 4 days. Eating well, no diarrhea or constipation. Blood sugars have been better since. Normally blood sugars in the 90s.  Hx of dysphagia. On dysphagia diet.   Had a nose bleed during vomiting episodes 4 days ago. Had not had another occurrence until on the way over here and she blew her nose and it started bleeding again for about 5 mins then resolved.     Review of Systems:  Review of Systems  Constitutional: Negative for fever, chills, activity change and fatigue.       Gained some weight  HENT: Negative for congestion.   Eyes:       Left eye droop with facial droop  Respiratory: Negative for cough, choking and shortness of breath.   Cardiovascular: Negative for chest pain and leg swelling.  Gastrointestinal: Negative for nausea, vomiting, abdominal pain, diarrhea and constipation.  Genitourinary: Negative for dysuria and frequency.  Neurological: Negative for weakness.    Past Medical History  Diagnosis Date  . Hyperlipidemia   . Diabetes mellitus without complication   . Hypotension   . Stroke   . Vascular dementia without behavioral disturbance   . History of recurrent UTI (urinary tract infection)   . Benign parotid tumor 1986-1987    s/p resection x 2  . Gallbladder disorder   . Memory loss   . Intracerebral hemorrhage   . Gait disturbance   . Confusion    04/19/14:EEG showed abnormal with mild to moderate slow wave over the left frontotemporal area and mild slow wave abnormality in the right temporal region without sz activity    Past Surgical History  Procedure Laterality Date  . Tonsillectomy      childhood  . Cataract extraction  2012    Dr. Bing Plume  . Removal partoid tumor  1986   Social History:   reports that she has never smoked. She does not have any smokeless tobacco history on file. She reports that she does not drink alcohol or use illicit drugs.  Family History  Problem Relation Age of Onset  . Heart disease Mother   . Heart disease Father   . High blood pressure Sister   . High Cholesterol Sister   . Heart disease Sister   . Polymyositis Daughter   . Diabetes Daughter   . Stroke Other     aunt    Medications: Patient's Medications  New Prescriptions   No medications on file  Previous Medications   ARTIFICIAL TEAR OINTMENT (LUBRICANT EYE) OINT    Apply to eye at bedtime.   CLOPIDOGREL (PLAVIX) 75 MG TABLET    Take 1 tablet (75 mg total) by mouth daily. 1   DONEPEZIL (ARICEPT) 5 MG TABLET    Take 5 mg by mouth at bedtime.   EZETIMIBE (ZETIA)  10 MG TABLET    Take 10 mg by mouth daily. 1 tabltet every night at bed time   GLUCOSE BLOOD TEST STRIP    OneTouch Ultra 2 Check blood sugar 3-4 times daily as directed DX E11.40   PANTOPRAZOLE (PROTONIX) 40 MG TABLET    1 tab by mouth 2 times a day with food   POLYETHYL GLYCOL-PROPYL GLYCOL 0.4-0.3 % SOLN    Apply to eye.  Modified Medications   No medications on file  Discontinued Medications   No medications on file     Physical Exam:  Filed Vitals:   04/02/15 1127  BP: 138/60  Pulse: 59  Temp: 97.9 F (36.6 C)  TempSrc: Oral  Resp: 18  Height: 5\' 4"  (1.626 m)  Weight: 117 lb 12.8 oz (53.434 kg)  SpO2: 98%    Physical Exam  Constitutional: No distress.  Thin white female  HENT:  Head: Normocephalic and atraumatic.  Right Ear: External ear normal.  Left  Ear: External ear normal.  Nose: Nose normal.  Mouth/Throat: Oropharynx is clear and moist. No oropharyngeal exudate.  Eyes: Pupils are equal, round, and reactive to light.  Neck: Neck supple.  Cardiovascular: Normal rate, regular rhythm and normal heart sounds.   Pulmonary/Chest: Effort normal and breath sounds normal.  Abdominal: Soft. Bowel sounds are normal. She exhibits no distension. There is no tenderness. There is no rebound and no guarding.  Neurological: She is alert.  Left eye droop and facial droop noted  Skin: Skin is warm and dry.    Labs reviewed: Basic Metabolic Panel:  Recent Labs  06/28/14 1043  NA 140  K 4.5  CL 101  CO2 25  GLUCOSE 85  BUN 42*  CREATININE 1.16*  CALCIUM 9.0   Liver Function Tests:  Recent Labs  06/28/14 1043  AST 13  ALT 8  ALKPHOS 77  BILITOT 0.5  PROT 6.7   No results for input(s): LIPASE, AMYLASE in the last 8760 hours. No results for input(s): AMMONIA in the last 8760 hours. CBC:  Recent Labs  06/28/14 1043  WBC 8.6  NEUTROABS 4.8  HGB 15.4  HCT 46.1  MCV 92  PLT 179   Lipid Panel: No results for input(s): CHOL, HDL, LDLCALC, TRIG, CHOLHDL, LDLDIRECT in the last 8760 hours. TSH: No results for input(s): TSH in the last 8760 hours. A1C: Lab Results  Component Value Date   HGBA1C 6.2* 11/30/2014     Assessment/Plan 1. Non-intractable vomiting without nausea, vomiting of unspecified type -no further episodes, eating pts normal diet now without difficulty.  - Comprehensive metabolic panel r/o lab abnormalities -reassurance given to granddaughter -cont diet as tolerates  2. Epistaxis -2 short episodes without significant bleeding -to use nasal saline to bilateral nares TID for 1 week, not to blow nose or place pressure - CBC with Differential -return precautions discussed   Janett Billow K. Harle Battiest  [facility] [phone] 8 am - 5 pm) [phone] (after  hours)

## 2015-04-02 NOTE — Patient Instructions (Addendum)
Use plain saline nasal spray to bilateral nares three times daily for 1 week  Do not blow your nose  Will get blood work today   Keep follow up appt

## 2015-04-03 LAB — CBC WITH DIFFERENTIAL/PLATELET
Basophils Absolute: 0 10*3/uL (ref 0.0–0.2)
Basos: 0 %
EOS (ABSOLUTE): 0.4 10*3/uL (ref 0.0–0.4)
Eos: 3 %
Hematocrit: 43.6 % (ref 34.0–46.6)
Hemoglobin: 14.6 g/dL (ref 11.1–15.9)
Immature Grans (Abs): 0 10*3/uL (ref 0.0–0.1)
Immature Granulocytes: 0 %
LYMPHS ABS: 2.9 10*3/uL (ref 0.7–3.1)
Lymphs: 25 %
MCH: 29.7 pg (ref 26.6–33.0)
MCHC: 33.5 g/dL (ref 31.5–35.7)
MCV: 89 fL (ref 79–97)
MONOS ABS: 1.4 10*3/uL — AB (ref 0.1–0.9)
Monocytes: 12 %
NEUTROS PCT: 60 %
Neutrophils Absolute: 7.1 10*3/uL — ABNORMAL HIGH (ref 1.4–7.0)
Platelets: 163 10*3/uL (ref 150–379)
RBC: 4.91 x10E6/uL (ref 3.77–5.28)
RDW: 13.3 % (ref 12.3–15.4)
WBC: 11.8 10*3/uL — ABNORMAL HIGH (ref 3.4–10.8)

## 2015-04-03 LAB — COMPREHENSIVE METABOLIC PANEL
A/G RATIO: 1.5 (ref 1.1–2.5)
ALBUMIN: 3.9 g/dL (ref 3.5–4.7)
ALT: 15 IU/L (ref 0–32)
AST: 14 IU/L (ref 0–40)
Alkaline Phosphatase: 78 IU/L (ref 39–117)
BILIRUBIN TOTAL: 0.5 mg/dL (ref 0.0–1.2)
BUN/Creatinine Ratio: 32 — ABNORMAL HIGH (ref 11–26)
BUN: 38 mg/dL — AB (ref 8–27)
CO2: 25 mmol/L (ref 18–29)
Calcium: 9 mg/dL (ref 8.7–10.3)
Chloride: 103 mmol/L (ref 97–108)
Creatinine, Ser: 1.2 mg/dL — ABNORMAL HIGH (ref 0.57–1.00)
GFR, EST AFRICAN AMERICAN: 47 mL/min/{1.73_m2} — AB (ref >59)
GFR, EST NON AFRICAN AMERICAN: 41 mL/min/{1.73_m2} — AB (ref >59)
Globulin, Total: 2.6 g/dL (ref 1.5–4.5)
Glucose: 76 mg/dL (ref 65–99)
Potassium: 5.5 mmol/L — ABNORMAL HIGH (ref 3.5–5.2)
Sodium: 141 mmol/L (ref 134–144)
Total Protein: 6.5 g/dL (ref 6.0–8.5)

## 2015-04-09 ENCOUNTER — Other Ambulatory Visit: Payer: Self-pay | Admitting: *Deleted

## 2015-04-09 MED ORDER — PANTOPRAZOLE SODIUM 40 MG PO TBEC: DELAYED_RELEASE_TABLET | ORAL | Status: DC

## 2015-04-09 NOTE — Telephone Encounter (Signed)
[facility]

## 2015-04-30 ENCOUNTER — Other Ambulatory Visit: Payer: Self-pay | Admitting: *Deleted

## 2015-04-30 MED ORDER — EZETIMIBE 10 MG PO TABS: ORAL_TABLET | ORAL | Status: DC

## 2015-04-30 NOTE — Telephone Encounter (Signed)
Erin Hubbard, daughter requested to be faxed to pharmacy.

## 2015-05-20 ENCOUNTER — Ambulatory Visit (INDEPENDENT_AMBULATORY_CARE_PROVIDER_SITE_OTHER): Payer: Commercial Managed Care - HMO | Admitting: Internal Medicine

## 2015-05-20 ENCOUNTER — Encounter: Payer: Self-pay | Admitting: Internal Medicine

## 2015-05-20 VITALS — BP 130/60 | HR 59 | Temp 98.0°F | Resp 20 | Ht 64.0 in | Wt 118.0 lb

## 2015-05-20 DIAGNOSIS — E1149 Type 2 diabetes mellitus with other diabetic neurological complication: Secondary | ICD-10-CM

## 2015-05-20 DIAGNOSIS — I951 Orthostatic hypotension: Secondary | ICD-10-CM

## 2015-05-20 DIAGNOSIS — F0150 Vascular dementia without behavioral disturbance: Secondary | ICD-10-CM | POA: Diagnosis not present

## 2015-05-20 DIAGNOSIS — E1143 Type 2 diabetes mellitus with diabetic autonomic (poly)neuropathy: Secondary | ICD-10-CM | POA: Diagnosis not present

## 2015-05-20 DIAGNOSIS — I69391 Dysphagia following cerebral infarction: Secondary | ICD-10-CM | POA: Diagnosis not present

## 2015-05-20 DIAGNOSIS — Z23 Encounter for immunization: Secondary | ICD-10-CM

## 2015-05-20 DIAGNOSIS — E1140 Type 2 diabetes mellitus with diabetic neuropathy, unspecified: Secondary | ICD-10-CM

## 2015-05-20 NOTE — Progress Notes (Signed)
Patient ID: Erin Hubbard, female   DOB: 1925-03-02, 81 y.o.   MRN: [id number]   Location:  [facility]  Code Status: DNR Goals of Care: Advanced Directive information Does patient have an advance directive?: Yes, Type of Advance Directive: [place];Living will;Out of facility DNR (pink MOST or yellow form), Does patient want to make changes to advanced directive?: No - Patient declined   Chief Complaint  Patient presents with  . Medical Management of Chronic Issues    HPI: Patient is a 81 y.o. white female seen in the office today for med mgt of chronic diseases.    Sleeps a lot in the daytime.  Is unaware she naps as much as she does.  Unclear what time she sleeps.  Will read when she goes to bed, and may forget to turn off light.  Granddaughter also unsure when she wakes up before she comes out of her room to breakfast.  Aricept has not affected her sleeping.    Vascular dementia:  Some progression per her granddaughter.  ST came out and worked with her.  She used to call her sister frequently and remembered that number, but she got frustrated trying to remember the ones she doesn't use. Dates are also harder.  Almyra Free was [place].  Word-finding difficult, but working on it helped 2x per week for several months.    She's been clearing her throat more.  Unclear if it's due to dysphagia or allergies.  Her granddaughter notes some nasal congestion.  Has chronic runny nose and eye on left.  Also blowing her nose more.  Pt not bothered by it.    Still eating her snacks and glucose well regulated.  Some days, she's just done with food.  Does not like rice.  Is eating her meats and picks at veggies (says he misses seasoning).    Weight is doing much better.    No diarrhea anymore.  Has some infrequent bms, but it's better than it once was since she's eating better and more often.  Still struggles with fluid intake.    She is off balance  when first stands and balance overall on her feet has worsened some.  Likes her mobility, but is somewhat resistant to having PT come in.  No falls since last visit.    Spirits are good.  Feels well.  Likes to go to church on Sundays and choir practice.  Likes to otherwise be alone.    No pain whatsoever.    Had bad experience at podiatry.    Review of Systems:  Review of Systems  Constitutional: Negative for fever and chills.       Has gained some weight/stabilized  HENT: Positive for congestion.   Eyes:       Left facial nerve palsy  Respiratory: Positive for cough. Negative for shortness of breath.        Clearing throat more in past few weeks  Cardiovascular: Negative for chest pain.  Gastrointestinal: Negative for abdominal pain, diarrhea, blood in stool and melena.  Genitourinary: Negative for dysuria.  Musculoskeletal: Negative for falls.  Neurological: Positive for dizziness. Negative for headaches.       Unsteady gait  Psychiatric/Behavioral: Positive for memory loss. Negative for depression. The patient is not nervous/anxious and does not have insomnia.     Past Medical History  Diagnosis Date  . Hyperlipidemia   . Diabetes mellitus without complication   . Hypotension   .  Stroke   . Vascular dementia without behavioral disturbance   . History of recurrent UTI (urinary tract infection)   . Benign parotid tumor 1986-1987    s/p resection x 2  . Gallbladder disorder   . Memory loss   . Intracerebral hemorrhage   . Gait disturbance   . Confusion     04/19/14:EEG showed abnormal with mild to moderate slow wave over the left frontotemporal area and mild slow wave abnormality in the right temporal region without sz activity     Past Surgical History  Procedure Laterality Date  . Tonsillectomy      childhood  . Cataract extraction  2012    Dr. Bing Plume  . Removal partoid tumor  1986    Allergies  Allergen Reactions  . Penicillins    Medications: Patient's  Medications  New Prescriptions   No medications on file  Previous Medications   ARTIFICIAL TEAR OINTMENT (LUBRICANT EYE) OINT    Apply to eye at bedtime.   CLOPIDOGREL (PLAVIX) 75 MG TABLET    Take 1 tablet (75 mg total) by mouth daily. 1   DONEPEZIL (ARICEPT) 5 MG TABLET    Take 5 mg by mouth at bedtime.   EZETIMIBE (ZETIA) 10 MG TABLET    Take one tablet by mouth once daily for cholesterol   GLUCOSE BLOOD TEST STRIP    OneTouch Ultra 2 Check blood sugar 3-4 times daily as directed DX E11.40   PANTOPRAZOLE (PROTONIX) 40 MG TABLET    Take one tablet by mouth twice daily with food for stomach   POLYETHYL GLYCOL-PROPYL GLYCOL 0.4-0.3 % SOLN    Apply to eye.  Modified Medications   No medications on file  Discontinued Medications   No medications on file    Physical Exam: Filed Vitals:   05/20/15 1101  BP: 130/60  Pulse: 59  Temp: 98 F (36.7 C)  TempSrc: Oral  Resp: 20  Height: 5\' 4"  (1.626 m)  Weight: 118 lb (53.524 kg)  SpO2: 97%   Physical Exam  Constitutional: She appears well-developed and well-nourished. No distress.  Thin white female  Eyes:  Left facial droop from nerve palsy  Neck: Neck supple.  Cardiovascular: Normal rate, regular rhythm, normal heart sounds and intact distal pulses.   Pulmonary/Chest: Effort normal and breath sounds normal. No respiratory distress. She has no wheezes. She has no rales.  Abdominal: Soft. Bowel sounds are normal. She exhibits no distension. There is no tenderness.  Musculoskeletal: Normal range of motion.  Ambulates with cane, somewhat unsteady on standing, must stand slowly  Neurological: She is alert.  Pleasant, some short term memory loss and aphasia  Skin: Skin is warm and dry.  Psychiatric: She has a normal mood and affect.    Labs reviewed: Basic Metabolic Panel:  Recent Labs  06/28/14 1043 04/02/15 1127  NA 140 141  K 4.5 5.5*  CL 101 103  CO2 25 25  GLUCOSE 85 76  BUN 42* 38*  CREATININE 1.16* 1.20*  CALCIUM  9.0 9.0   Liver Function Tests:  Recent Labs  06/28/14 1043 04/02/15 1127  AST 13 14  ALT 8 15  ALKPHOS 77 78  BILITOT 0.5 0.5  PROT 6.7 6.5   No results for input(s): LIPASE, AMYLASE in the last 8760 hours. No results for input(s): AMMONIA in the last 8760 hours. CBC:  Recent Labs  06/28/14 1043 04/02/15 1127  WBC 8.6 11.8*  NEUTROABS 4.8 7.1*  HGB 15.4  --   HCT  46.1 43.6  MCV 92  --   PLT 179  --    Lipid Panel: No results for input(s): CHOL, HDL, LDLCALC, TRIG, CHOLHDL, LDLDIRECT in the last 8760 hours. Lab Results  Component Value Date   HGBA1C 6.2* 11/30/2014    Assessment/Plan 1. Type 2 diabetes mellitus with neurological manifestations, controlled -much improved with more regular snacks and balanced diet - CBC with Differential/Platelet - Basic metabolic panel - Hemoglobin A1c  2. Orthostatic hypotension -still occasionally a problem with her poor fluid intake  -to stand slowly and always use her cane -will consider PT if balance continues to worsen  3. Dysphagia as late effect of stroke -seems this is stable -has some increased cough and throat clearing with allergy season--not bothering pt  4. Vascular dementia without behavioral disturbance -cont her aricept for memory and zetia/plavix for vascular risk reduction  5. Diabetic autonomic neuropathy associated with type 2 diabetes mellitus -had no related complaints -counseled on diabetic foot care  6. Need for prophylactic vaccination and inoculation against influenza - Flu Vaccine QUAD 36+ mos PF IM (Fluarix & Fluzone Quad PF) was given today  Labs/tests ordered:   Orders Placed This Encounter  Procedures  . Flu Vaccine QUAD 36+ mos PF IM (Fluarix & Fluzone Quad PF)  . CBC with Differential/Platelet  . Basic metabolic panel  . Hemoglobin A1c    Next appt:  3 mos for med mgt  Vinia Jemmott L. Mistey Hoffert, D.O. [facility] [address], [place] [address] Cell Phone (Mon-Fri 8am-5pm):  [phone] On Call:  [phone] & follow prompts after 5pm & weekends Office Phone:  [phone] Office Fax:  [phone]

## 2015-05-21 LAB — BASIC METABOLIC PANEL
BUN/Creatinine Ratio: 41 — ABNORMAL HIGH (ref 11–26)
BUN: 47 mg/dL — ABNORMAL HIGH (ref 8–27)
CO2: 24 mmol/L (ref 18–29)
Calcium: 9 mg/dL (ref 8.7–10.3)
Chloride: 102 mmol/L (ref 97–108)
Creatinine, Ser: 1.14 mg/dL — ABNORMAL HIGH (ref 0.57–1.00)
GFR calc Af Amer: 50 mL/min/{1.73_m2} — ABNORMAL LOW (ref >59)
GFR calc non Af Amer: 43 mL/min/{1.73_m2} — ABNORMAL LOW (ref >59)
Glucose: 133 mg/dL — ABNORMAL HIGH (ref 65–99)
Potassium: 5.3 mmol/L — ABNORMAL HIGH (ref 3.5–5.2)
Sodium: 140 mmol/L (ref 134–144)

## 2015-05-21 LAB — CBC WITH DIFFERENTIAL/PLATELET
Basophils Absolute: 0 10*3/uL (ref 0.0–0.2)
Basos: 1 %
EOS (ABSOLUTE): 0.7 10*3/uL — ABNORMAL HIGH (ref 0.0–0.4)
Eos: 8 %
Hematocrit: 44.7 % (ref 34.0–46.6)
Hemoglobin: 14.7 g/dL (ref 11.1–15.9)
Immature Grans (Abs): 0 10*3/uL (ref 0.0–0.1)
Immature Granulocytes: 0 %
Lymphocytes Absolute: 2.7 10*3/uL (ref 0.7–3.1)
Lymphs: 31 %
MCH: 30.4 pg (ref 26.6–33.0)
MCHC: 32.9 g/dL (ref 31.5–35.7)
MCV: 93 fL (ref 79–97)
Monocytes Absolute: 0.8 10*3/uL (ref 0.1–0.9)
Monocytes: 10 %
Neutrophils Absolute: 4.4 10*3/uL (ref 1.4–7.0)
Neutrophils: 50 %
Platelets: 180 10*3/uL (ref 150–379)
RBC: 4.83 x10E6/uL (ref 3.77–5.28)
RDW: 13.2 % (ref 12.3–15.4)
WBC: 8.6 10*3/uL (ref 3.4–10.8)

## 2015-05-21 LAB — HEMOGLOBIN A1C
Est. average glucose Bld gHb Est-mCnc: 128 mg/dL
Hgb A1c MFr Bld: 6.1 % — ABNORMAL HIGH (ref 4.8–5.6)

## 2015-06-10 ENCOUNTER — Encounter: Payer: Self-pay | Admitting: Internal Medicine

## 2015-06-10 ENCOUNTER — Ambulatory Visit (INDEPENDENT_AMBULATORY_CARE_PROVIDER_SITE_OTHER): Payer: Commercial Managed Care - HMO | Admitting: Internal Medicine

## 2015-06-10 VITALS — BP 118/56 | HR 55 | Temp 98.0°F | Resp 20 | Ht 64.0 in | Wt 120.2 lb

## 2015-06-10 DIAGNOSIS — R1110 Vomiting, unspecified: Secondary | ICD-10-CM | POA: Diagnosis not present

## 2015-06-10 DIAGNOSIS — R197 Diarrhea, unspecified: Secondary | ICD-10-CM

## 2015-06-10 NOTE — Progress Notes (Signed)
Patient ID: Erin Hubbard, female   DOB: Jan 20, 1925, 81 y.o.   MRN: [id number]   Location: [place] Provider: Rexene Edison. Mariea Clonts, D.O., C.M.D.  Code Status: DNR Goals of Care: Advanced Directive information Does patient have an advance directive?: Yes  Chief Complaint  Patient presents with  . Acute Visit    Vomiting and loose stools started last night    HPI: Patient is a 81 y.o. female seen in the office today for an acute visit with a GI upset that occurred yesterday after eating things she normally eats--more sweets and starches.  No known sick contacts.  Had been sick at both ends on the toilet--just one time.  Happened between 10-11pm 4 hrs after her supper.  Feels puny a little.  Slept fine last night.  No more episodes today.  Had hypoglycemia at 66 this am after she had nothing else last night.  Had a plain yogurt this am and egg whites and two yogurts and crackers since.  Missed two days of protonix also.  Had been very cold last night.   Review of Systems:  Review of Systems  Constitutional: Positive for chills and malaise/fatigue. Negative for fever.  Respiratory: Negative for shortness of breath.   Cardiovascular: Negative for chest pain.  Gastrointestinal: Positive for heartburn, nausea, vomiting and diarrhea. Negative for abdominal pain, constipation, blood in stool and melena.  Genitourinary: Negative for dysuria.  Musculoskeletal: Negative for falls.  Neurological: Positive for weakness. Negative for dizziness.  Psychiatric/Behavioral: Positive for memory loss.    Past Medical History  Diagnosis Date  . Hyperlipidemia   . Diabetes mellitus without complication ([place])   . Hypotension   . Stroke ([place])   . Vascular dementia without behavioral disturbance   . History of recurrent UTI (urinary tract infection)   . Benign parotid tumor 1986-1987    s/p resection x 2  . Gallbladder disorder   . Memory loss   . Intracerebral hemorrhage ([place])   . Gait  disturbance   . Confusion     04/19/14:EEG showed abnormal with mild to moderate slow wave over the left frontotemporal area and mild slow wave abnormality in the right temporal region without sz activity     Past Surgical History  Procedure Laterality Date  . Tonsillectomy      childhood  . Cataract extraction  2012    Dr. Bing Plume  . Removal partoid tumor  1986    Allergies  Allergen Reactions  . Penicillins       Medication List       This list is accurate as of: 06/10/15 11:45 AM.  Always use your most recent med list.               clopidogrel 75 MG tablet  Commonly known as:  PLAVIX  Take 1 tablet (75 mg total) by mouth daily. 1     donepezil 5 MG tablet  Commonly known as:  ARICEPT  Take 5 mg by mouth at bedtime.     ezetimibe 10 MG tablet  Commonly known as:  ZETIA  Take one tablet by mouth once daily for cholesterol     glucose blood test strip  OneTouch Ultra 2 Check blood sugar 3-4 times daily as directed DX E11.40     LUBRICANT EYE Oint  Apply to eye at bedtime.     pantoprazole 40 MG tablet  Commonly known as:  PROTONIX  Take one tablet by mouth twice daily with food for stomach  Polyethyl Glycol-Propyl Glycol 0.4-0.3 % Soln  Apply to eye.        Health Maintenance  Topic Date Due  . OPHTHALMOLOGY EXAM  07/05/1937  . TETANUS/TDAP  07/05/1946  . ZOSTAVAX  07/06/1987  . DEXA SCAN  07/05/1992  . HEMOGLOBIN A1C  11/17/2015  . FOOT EXAM  11/30/2015  . URINE MICROALBUMIN  11/30/2015  . INFLUENZA VACCINE  03/24/2016  . PNA vac Low Risk Adult  Completed    Physical Exam: Filed Vitals:   06/10/15 1129  BP: 118/56  Pulse: 55  Temp: 98 F (36.7 C)  TempSrc: Oral  Resp: 20  Height: 5\' 4"  (1.626 m)  Weight: 120 lb 3.2 oz (54.522 kg)  SpO2: 96%   Body mass index is 20.62 kg/(m^2). Physical Exam  Constitutional:  Thin white female  HENT:  Head: Normocephalic and atraumatic.  Cardiovascular: Normal rate, regular rhythm, normal heart  sounds and intact distal pulses.   Pulmonary/Chest: Effort normal and breath sounds normal.  Abdominal: Soft. Bowel sounds are normal. She exhibits no distension and no mass. There is no tenderness.  Musculoskeletal:  Walks with cane  Neurological: She is alert.  Skin: Skin is warm and dry. There is pallor.  Psychiatric: She has a normal mood and affect.    Labs reviewed: Basic Metabolic Panel:  Recent Labs  06/28/14 1043 04/02/15 1127 05/20/15 1138  NA 140 141 140  K 4.5 5.5* 5.3*  CL 101 103 102  CO2 25 25 24   GLUCOSE 85 76 133*  BUN 42* 38* 47*  CREATININE 1.16* 1.20* 1.14*  CALCIUM 9.0 9.0 9.0   Liver Function Tests:  Recent Labs  06/28/14 1043 04/02/15 1127  AST 13 14  ALT 8 15  ALKPHOS 77 78  BILITOT 0.5 0.5  PROT 6.7 6.5  ALBUMIN 4.0 3.9   No results for input(s): LIPASE, AMYLASE in the last 8760 hours. No results for input(s): AMMONIA in the last 8760 hours. CBC:  Recent Labs  06/28/14 1043 04/02/15 1127 05/20/15 1138  WBC 8.6 11.8* 8.6  NEUTROABS 4.8 7.1* 4.4  HGB 15.4  --   --   HCT 46.1 43.6 44.7  MCV 92  --   --   PLT 179  --   --    Lipid Panel: No results for input(s): CHOL, HDL, LDLCALC, TRIG, CHOLHDL, LDLDIRECT in the last 8760 hours. Lab Results  Component Value Date   HGBA1C 6.1* 05/20/2015    Assessment/Plan 1. Vomiting and diarrhea - seems this was due to altering her diet and missing her protonix (had a party Saturday and she ate differently) -increase fluids today and start gradually to reintroduce regular foods -check labs to r/o electrolyte deficiencies and assess if there is infection -seems she is better now - Comprehensive metabolic panel - CBC with Differential   Labs/tests ordered:   Orders Placed This Encounter  Procedures  . Comprehensive metabolic panel  . CBC with Differential   Next appt:  08/23/2015 Janasha Barkalow L. Mavrick Mcquigg, D.O. [facility] [address], [place] [address] Cell Phone (Mon-Fri 8am-5pm):  [phone] On Call:  [phone] & follow prompts after 5pm & weekends Office Phone:  [phone] Office Fax:  [phone]

## 2015-06-11 LAB — COMPREHENSIVE METABOLIC PANEL
ALT: 23 IU/L (ref 0–32)
AST: 19 IU/L (ref 0–40)
Albumin/Globulin Ratio: 1.4 (ref 1.1–2.5)
Albumin: 3.7 g/dL (ref 3.5–4.7)
Alkaline Phosphatase: 83 IU/L (ref 39–117)
BUN/Creatinine Ratio: 42 — ABNORMAL HIGH (ref 11–26)
BUN: 43 mg/dL — ABNORMAL HIGH (ref 8–27)
Bilirubin Total: 0.5 mg/dL (ref 0.0–1.2)
CO2: 23 mmol/L (ref 18–29)
Calcium: 8.8 mg/dL (ref 8.7–10.3)
Chloride: 102 mmol/L (ref 97–106)
Creatinine, Ser: 1.02 mg/dL — ABNORMAL HIGH (ref 0.57–1.00)
GFR calc Af Amer: 57 mL/min/{1.73_m2} — ABNORMAL LOW (ref >59)
GFR calc non Af Amer: 50 mL/min/{1.73_m2} — ABNORMAL LOW (ref >59)
Globulin, Total: 2.7 g/dL (ref 1.5–4.5)
Glucose: 125 mg/dL — ABNORMAL HIGH (ref 65–99)
Potassium: 5.1 mmol/L (ref 3.5–5.2)
Sodium: 141 mmol/L (ref 136–144)
Total Protein: 6.4 g/dL (ref 6.0–8.5)

## 2015-06-11 LAB — CBC WITH DIFFERENTIAL/PLATELET
Basophils Absolute: 0 10*3/uL (ref 0.0–0.2)
Basos: 0 %
EOS (ABSOLUTE): 0.2 10*3/uL (ref 0.0–0.4)
Eos: 3 %
Hematocrit: 43.1 % (ref 34.0–46.6)
Hemoglobin: 14.4 g/dL (ref 11.1–15.9)
Immature Grans (Abs): 0 10*3/uL (ref 0.0–0.1)
Immature Granulocytes: 0 %
Lymphocytes Absolute: 2.3 10*3/uL (ref 0.7–3.1)
Lymphs: 27 %
MCH: 29.9 pg (ref 26.6–33.0)
MCHC: 33.4 g/dL (ref 31.5–35.7)
MCV: 89 fL (ref 79–97)
Monocytes Absolute: 0.8 10*3/uL (ref 0.1–0.9)
Monocytes: 9 %
Neutrophils Absolute: 5.5 10*3/uL (ref 1.4–7.0)
Neutrophils: 61 %
Platelets: 177 10*3/uL (ref 150–379)
RBC: 4.82 x10E6/uL (ref 3.77–5.28)
RDW: 12.9 % (ref 12.3–15.4)
WBC: 8.8 10*3/uL (ref 3.4–10.8)

## 2015-06-17 DIAGNOSIS — I6782 Cerebral ischemia: Secondary | ICD-10-CM | POA: Insufficient documentation

## 2015-06-17 DIAGNOSIS — G939 Disorder of brain, unspecified: Secondary | ICD-10-CM | POA: Insufficient documentation

## 2015-07-09 ENCOUNTER — Other Ambulatory Visit: Payer: Self-pay | Admitting: *Deleted

## 2015-07-09 MED ORDER — PANTOPRAZOLE SODIUM 40 MG PO TBEC: DELAYED_RELEASE_TABLET | ORAL | Status: DC

## 2015-07-09 NOTE — Telephone Encounter (Signed)
[facility] 

## 2015-08-08 ENCOUNTER — Other Ambulatory Visit: Payer: Self-pay | Admitting: *Deleted

## 2015-08-08 MED ORDER — EZETIMIBE 10 MG PO TABS: ORAL_TABLET | ORAL | Status: DC

## 2015-08-08 NOTE — Telephone Encounter (Signed)
[facility] 

## 2015-08-12 ENCOUNTER — Telehealth: Payer: Self-pay | Admitting: *Deleted

## 2015-08-12 NOTE — Telephone Encounter (Signed)
Patient daughter, Curt Bears called and stated that patient has cold sx with cough. No Fever.  Would like to know what she can take OTC before it gets worse. Please Advise.

## 2015-08-13 NOTE — Telephone Encounter (Signed)
Patient daughter Notified and agreed.

## 2015-08-13 NOTE — Telephone Encounter (Signed)
I recommend coricidin bp cough syrup 2 tsp every 6 hrs as needed for cough. She may also use plain mucinex for congestion.   Warm humidity with a humidifier will help.   Encourage hydration and if she is unable to eat or drink or develops a fever or shortness of breath, she needs evaluation asap.

## 2015-08-21 ENCOUNTER — Ambulatory Visit (INDEPENDENT_AMBULATORY_CARE_PROVIDER_SITE_OTHER): Payer: Commercial Managed Care - HMO | Admitting: Internal Medicine

## 2015-08-21 ENCOUNTER — Encounter: Payer: Self-pay | Admitting: Internal Medicine

## 2015-08-21 VITALS — BP 98/58 | HR 57 | Temp 97.8°F | Resp 20 | Ht 64.0 in | Wt 120.6 lb

## 2015-08-21 DIAGNOSIS — F0150 Vascular dementia without behavioral disturbance: Secondary | ICD-10-CM | POA: Diagnosis not present

## 2015-08-21 DIAGNOSIS — E1149 Type 2 diabetes mellitus with other diabetic neurological complication: Secondary | ICD-10-CM

## 2015-08-21 DIAGNOSIS — I951 Orthostatic hypotension: Secondary | ICD-10-CM

## 2015-08-21 DIAGNOSIS — J209 Acute bronchitis, unspecified: Secondary | ICD-10-CM | POA: Diagnosis not present

## 2015-08-21 MED ORDER — DOXYCYCLINE HYCLATE 100 MG PO TABS: 100.0000 mg | ORAL_TABLET | Freq: Two times a day (BID) | ORAL | Status: DC

## 2015-08-21 MED ORDER — BENZONATATE 100 MG PO CAPS: 200.0000 mg | ORAL_CAPSULE | Freq: Three times a day (TID) | ORAL | Status: DC | PRN

## 2015-08-21 NOTE — Patient Instructions (Signed)
Push fluids and rest  Take all medications as ordered  Take probiotic OTC daily while on antibiotic  Continue other medications as ordered  Follow up as scheduled with Dr Mariea Clonts

## 2015-08-21 NOTE — Progress Notes (Signed)
Patient ID: Erin Hubbard, female   DOB: Oct 08, 1924, 81 y.o.   MRN: [id number]    Location:    [facility]   Place of Service:   OFFICE  Chief Complaint  Patient presents with  . Acute Visit    Patients c/o Cold and BS elevated  all started a week before christmas  . OTHER    Daughter in room with patient    HPI:  81 yo female seen today for URI sx's. She has dementia and is a poor historian. Hx obtained from chart and daughter. She has DM and CBGs elevated at home 140-150s. Sx's x 10 days that has worsened over last several days. Cough is productive with green sputum. She has increased fatigue, rhinorrhea with bloody mucous occasionally, hoarseness, post nasal drip and reduced hearing. Denies CP, SOB, HA, dizziness, f/c, nausea, sinus pressure or ear pressure. Min reduced appetite. (+) sleep disturbance  Past Medical History  Diagnosis Date  . Hyperlipidemia   . Diabetes mellitus without complication ([place])   . Hypotension   . Stroke ([place])   . Vascular dementia without behavioral disturbance   . History of recurrent UTI (urinary tract infection)   . Benign parotid tumor 1986-1987    s/p resection x 2  . Gallbladder disorder   . Memory loss   . Intracerebral hemorrhage ([place])   . Gait disturbance   . Confusion     04/19/14:EEG showed abnormal with mild to moderate slow wave over the left frontotemporal area and mild slow wave abnormality in the right temporal region without sz activity     Past Surgical History  Procedure Laterality Date  . Tonsillectomy      childhood  . Cataract extraction  2012    Dr. Bing Plume  . Removal partoid tumor  1986    Patient Care Team: Gayland Curry, DO as PCP - General (Geriatric Medicine)  Social History   Social History  . Marital Status: Widowed    Spouse Name: N/A  . Number of Children: N/A  . Years of Education: N/A   Occupational History  . Not on file.   Social History Main Topics  . Smoking status: Never Smoker   . Smokeless  tobacco: Never Used  . Alcohol Use: No  . Drug Use: No  . Sexual Activity: Not on file   Other Topics Concern  . Not on file   Social History Narrative   Pt. Lives in a one story house, with four people, one large dog   Current/pass profession- paralegal   Pt. Does not exercise   Lives in house with her granddaughter who is her 24 hr caregiver   4 people total live in her home   Has a large dog   Has hcpoa, but no living will or dnr form at this time when establishing     reports that she has never smoked. She has never used smokeless tobacco. She reports that she does not drink alcohol or use illicit drugs.  Allergies  Allergen Reactions  . Penicillins     Medications: Patient's Medications  New Prescriptions   No medications on file  Previous Medications   ARTIFICIAL TEAR OINTMENT (LUBRICANT EYE) OINT    Apply to eye at bedtime.   CLOPIDOGREL (PLAVIX) 75 MG TABLET    Take 1 tablet (75 mg total) by mouth daily. 1   DONEPEZIL (ARICEPT) 5 MG TABLET    Take 5 mg by mouth at bedtime.   EZETIMIBE (ZETIA) 10 MG  TABLET    Take one tablet by mouth once daily for cholesterol   GLUCOSE BLOOD TEST STRIP    OneTouch Ultra 2 Check blood sugar 3-4 times daily as directed DX E11.40   PANTOPRAZOLE (PROTONIX) 40 MG TABLET    Take one tablet by mouth twice daily with food for stomach   POLYETHYL GLYCOL-PROPYL GLYCOL 0.4-0.3 % SOLN    Apply to eye.  Modified Medications   No medications on file  Discontinued Medications   No medications on file    Review of Systems  Unable to perform ROS: Dementia    Filed Vitals:   08/21/15 1135  BP: 98/58  Pulse: 57  Temp: 97.8 F (36.6 C)  TempSrc: Oral  Resp: 20  Height: 5\' 4"  (1.626 m)  Weight: 120 lb 9.6 oz (54.704 kg)  SpO2: 97%   Body mass index is 20.69 kg/(m^2).  Physical Exam  Constitutional: She appears well-developed. No distress.  HENT:  Left TM increased cerumen but no impaction. Right TM dull but no redness or bulging. No  sinus TTP. Nares patent and dry. Oropharynx cobblestoning and red but no exudate. Tongue deviates right; facial droop present. Voice is hoarse  Eyes: Pupils are equal, round, and reactive to light. Right eye exhibits no discharge. Left eye exhibits no discharge. No scleral icterus.  Neck: Neck supple.  Cardiovascular: Normal rate, regular rhythm and intact distal pulses.  Exam reveals no gallop and no friction rub.   Murmur (1/6 SEM) heard. Pulmonary/Chest: Effort normal. No respiratory distress. She has wheezes (end expiratory). She has no rales. She exhibits no tenderness.  Lymphadenopathy:    She has no cervical adenopathy.  Neurological: She is alert.  Skin: Skin is warm and dry. No rash noted.  Psychiatric: She has a normal mood and affect. Her behavior is normal.     Labs reviewed: Office Visit on 06/10/2015  Component Date Value Ref Range Status  . Glucose 06/10/2015 125* 65 - 99 mg/dL Final  . BUN 06/10/2015 43* 8 - 27 mg/dL Final  . Creatinine, Ser 06/10/2015 1.02* 0.57 - 1.00 mg/dL Final  . GFR calc non Af Amer 06/10/2015 50* >59 mL/min/1.73 Final  . GFR calc Af Amer 06/10/2015 57* >59 mL/min/1.73 Final  . BUN/Creatinine Ratio 06/10/2015 42* 11 - 26 Final  . Sodium 06/10/2015 141  136 - 144 mmol/L Final                 **Please note reference interval change**  . Potassium 06/10/2015 5.1  3.5 - 5.2 mmol/L Final                 **Please note reference interval change**  . Chloride 06/10/2015 102  97 - 106 mmol/L Final                 **Please note reference interval change**  . CO2 06/10/2015 23  18 - 29 mmol/L Final  . Calcium 06/10/2015 8.8  8.7 - 10.3 mg/dL Final  . Total Protein 06/10/2015 6.4  6.0 - 8.5 g/dL Final  . Albumin 06/10/2015 3.7  3.5 - 4.7 g/dL Final  . Globulin, Total 06/10/2015 2.7  1.5 - 4.5 g/dL Final  . Albumin/Globulin Ratio 06/10/2015 1.4  1.1 - 2.5 Final  . Bilirubin Total 06/10/2015 0.5  0.0 - 1.2 mg/dL Final  . Alkaline Phosphatase 06/10/2015 83   39 - 117 IU/L Final  . AST 06/10/2015 19  0 - 40 IU/L Final  . ALT 06/10/2015 23  0 - 32 IU/L Final  . WBC 06/10/2015 8.8  3.4 - 10.8 x10E3/uL Final  . RBC 06/10/2015 4.82  3.77 - 5.28 x10E6/uL Final  . Hemoglobin 06/10/2015 14.4  11.1 - 15.9 g/dL Final  . Hematocrit 06/10/2015 43.1  34.0 - 46.6 % Final  . MCV 06/10/2015 89  79 - 97 fL Final  . MCH 06/10/2015 29.9  26.6 - 33.0 pg Final  . MCHC 06/10/2015 33.4  31.5 - 35.7 g/dL Final  . RDW 06/10/2015 12.9  12.3 - 15.4 % Final  . Platelets 06/10/2015 177  150 - 379 x10E3/uL Final  . Neutrophils 06/10/2015 61   Final  . Lymphs 06/10/2015 27   Final  . Monocytes 06/10/2015 9   Final  . Eos 06/10/2015 3   Final  . Basos 06/10/2015 0   Final  . Neutrophils Absolute 06/10/2015 5.5  1.4 - 7.0 x10E3/uL Final  . Lymphocytes Absolute 06/10/2015 2.3  0.7 - 3.1 x10E3/uL Final  . Monocytes Absolute 06/10/2015 0.8  0.1 - 0.9 x10E3/uL Final  . EOS (ABSOLUTE) 06/10/2015 0.2  0.0 - 0.4 x10E3/uL Final  . Basophils Absolute 06/10/2015 0.0  0.0 - 0.2 x10E3/uL Final  . Immature Granulocytes 06/10/2015 0   Final  . Immature Grans (Abs) 06/10/2015 0.0  0.0 - 0.1 x10E3/uL Final    No results found.   Assessment/Plan   ICD-9-CM ICD-10-CM   1. Acute bronchitis, unspecified organism 466.0 J20.9 benzonatate (TESSALON PERLES) 100 MG capsule  2. Type 2 diabetes mellitus with neurological manifestations, controlled ([place]) with hyperglycemia due to #1 250.60 E11.49   3. Vascular dementia without behavioral disturbance - stable 290.40 F01.50   4. Orthostatic hypotension - stable 458.0 I95.1     Push fluids and rest  Take all medications as ordered. Abx BID x 10 days. tesalon perles TID prn cough  Take probiotic OTC daily while on antibiotic  Continue other medications as ordered  Follow up as scheduled with Dr Lorelei Pont S. Perlie Gold  [facility] [address] [place], [place]  [address] [phone] Cell (Monday-Friday 8 AM - 5 PM) [phone] After 5 PM and follow prompts

## 2015-08-23 ENCOUNTER — Ambulatory Visit: Payer: Commercial Managed Care - HMO | Admitting: Internal Medicine

## 2015-10-03 ENCOUNTER — Ambulatory Visit: Payer: Commercial Managed Care - HMO | Admitting: Internal Medicine

## 2015-10-07 ENCOUNTER — Other Ambulatory Visit: Payer: Self-pay

## 2015-10-07 MED ORDER — DONEPEZIL HCL 5 MG PO TABS: 5.0000 mg | ORAL_TABLET | Freq: Every day | ORAL | Status: DC

## 2015-11-06 ENCOUNTER — Other Ambulatory Visit: Payer: Self-pay | Admitting: *Deleted

## 2015-11-06 MED ORDER — CLOPIDOGREL BISULFATE 75 MG PO TABS: 75.0000 mg | ORAL_TABLET | Freq: Every day | ORAL | Status: DC

## 2015-11-06 MED ORDER — PANTOPRAZOLE SODIUM 40 MG PO TBEC: DELAYED_RELEASE_TABLET | ORAL | Status: DC

## 2015-11-06 NOTE — Telephone Encounter (Signed)
Curt Bears, daughter requested refills to be faxed to pharmacy.

## 2015-11-13 ENCOUNTER — Other Ambulatory Visit: Payer: Self-pay | Admitting: *Deleted

## 2015-11-13 MED ORDER — GLUCOSE BLOOD VI STRP
ORAL_STRIP | Status: DC
Start: 2015-11-13 — End: 2015-11-18

## 2015-11-13 NOTE — Telephone Encounter (Signed)
Patient daughter, Curt Bears requested

## 2015-11-14 ENCOUNTER — Other Ambulatory Visit: Payer: Self-pay

## 2015-11-14 NOTE — Telephone Encounter (Signed)
Prior authorization was received for OneTouch Ultra Test Strips. PA was initiated by calling [phone]. Patient ID# [id number].  PA form will be faxed to the office and should be completed, then faxed back to number listed on form.

## 2015-11-18 ENCOUNTER — Telehealth: Payer: Self-pay

## 2015-11-18 DIAGNOSIS — E1149 Type 2 diabetes mellitus with other diabetic neurological complication: Secondary | ICD-10-CM

## 2015-11-18 MED ORDER — ACCU-CHEK AVIVA DEVI: Status: DC

## 2015-11-18 MED ORDER — GLUCOSE BLOOD VI STRP: ORAL_STRIP | Status: DC

## 2015-11-18 MED ORDER — LANCETS ULTRA FINE MISC: Status: DC

## 2015-11-18 NOTE — Telephone Encounter (Signed)
PA received via fax for OneTouch Ultra Test Strips (check blood sugar 3-4 x daily). Patient's daughter called and said she spoke with [facility] and was told if we change the machine to one covered by patient's plan than a PA would not be needed. Barnetta Chapel (patient's daughter) did not recall the name of the covered machine.  I reviewed patient's medication list and noticed that patient is not insulin dependent and insurance does not authorize testing more than once daily for persons that are non-insulin dependent. I informed patient's daughter that I will call [facility] first to get the name of the alternative machine.   I called [facility] and spoke with Azzie Roup said Accu-chek is the preferred meter for the patient. Matt requested that I send a rx for Meter/Lancets/Teststrips. [id number] sent electronically

## 2015-11-21 ENCOUNTER — Encounter: Payer: Self-pay | Admitting: Internal Medicine

## 2015-11-21 ENCOUNTER — Ambulatory Visit (INDEPENDENT_AMBULATORY_CARE_PROVIDER_SITE_OTHER): Payer: Commercial Managed Care - HMO | Admitting: Internal Medicine

## 2015-11-21 VITALS — BP 122/54 | HR 65 | Temp 97.7°F | Resp 20 | Ht 64.0 in | Wt 121.8 lb

## 2015-11-21 DIAGNOSIS — F439 Reaction to severe stress, unspecified: Secondary | ICD-10-CM

## 2015-11-21 DIAGNOSIS — Z658 Other specified problems related to psychosocial circumstances: Secondary | ICD-10-CM | POA: Diagnosis not present

## 2015-11-21 DIAGNOSIS — F0150 Vascular dementia without behavioral disturbance: Secondary | ICD-10-CM | POA: Diagnosis not present

## 2015-11-21 DIAGNOSIS — I10 Essential (primary) hypertension: Secondary | ICD-10-CM | POA: Diagnosis not present

## 2015-11-21 DIAGNOSIS — E785 Hyperlipidemia, unspecified: Secondary | ICD-10-CM | POA: Diagnosis not present

## 2015-11-21 DIAGNOSIS — E1149 Type 2 diabetes mellitus with other diabetic neurological complication: Secondary | ICD-10-CM

## 2015-11-21 MED ORDER — LANCETS ULTRA FINE MISC
Status: DC
Start: 2015-11-21 — End: 2016-06-25

## 2015-11-21 MED ORDER — ACCU-CHEK AVIVA DEVI: Status: DC

## 2015-11-21 NOTE — Progress Notes (Signed)
Patient ID: Erin Hubbard, female   DOB: 1925/02/05, 82 y.o.   MRN: [id number]   Location:  [facility] Provider:  Keithen Capo L. Mariea Clonts, D.O., C.M.D.  Code Status: DNR Goals of Care:  Advanced Directives 08/21/2015  Does patient have an advance directive? Yes  Type of Advance Directive [place]  Does patient want to make changes to advanced directive? -  Copy of advanced directive(s) in chart? Yes     Chief Complaint  Patient presents with  . Medical Management of Chronic Issues    needs auditory test, having issues with test strips    HPI: Patient is a 82 y.o. female seen today for medical management of chronic diseases.    Audiology and ophthalmology set up.  Hard to tell what's wrong with her unless she checks it three times per day.  Accepts accucheck strips tid, but not something else.  Pt does her own checks.  Have been out of strips for a couple of weeks.    Pt's son has a mental illness and living with her.  Was not kept when tried to commit her.  Pt at her sister's and granddaughter's.  Has caused some increased confusion for her and schedule for food less consistent.  BP at goal.    Already ate this am.    Past Medical History  Diagnosis Date  . Hyperlipidemia   . Diabetes mellitus without complication ([place])   . Hypotension   . Stroke ([place])   . Vascular dementia without behavioral disturbance   . History of recurrent UTI (urinary tract infection)   . Benign parotid tumor 1986-1987    s/p resection x 2  . Gallbladder disorder   . Memory loss   . Intracerebral hemorrhage ([place])   . Gait disturbance   . Confusion     04/19/14:EEG showed abnormal with mild to moderate slow wave over the left frontotemporal area and mild slow wave abnormality in the right temporal region without sz activity     Past Surgical History  Procedure Laterality Date  . Tonsillectomy      childhood  . Cataract extraction  2012    Dr. Bing Plume  . Removal partoid tumor   1986    Allergies  Allergen Reactions  . Penicillins       Medication List       This list is accurate as of: 11/21/15  9:18 AM.  Always use your most recent med list.               ACCU-CHEK AVIVA device  Check blood sugar once daily as directed E11.49     clopidogrel 75 MG tablet  Commonly known as:  PLAVIX  Take 1 tablet (75 mg total) by mouth daily.     donepezil 5 MG tablet  Commonly known as:  ARICEPT  Take 1 tablet (5 mg total) by mouth at bedtime.     ezetimibe 10 MG tablet  Commonly known as:  ZETIA  Take one tablet by mouth once daily for cholesterol     glucose blood test strip  Accu-chek Aviva test strips, check blood sugar once daily as directed DX E11.49     LANCETS ULTRA FINE Misc  Accu-chek Aviva lancets, check blood sugar once daily as directed DX E11.49     LUBRICANT EYE Oint  Apply to eye at bedtime.     pantoprazole 40 MG tablet  Commonly known as:  PROTONIX  Take one tablet by mouth twice daily with  food for stomach     Polyethyl Glycol-Propyl Glycol 0.4-0.3 % Soln  Apply to eye.        Review of Systems:  Review of Systems  Constitutional: Negative for fever, chills and malaise/fatigue.  HENT: Negative for congestion.   Respiratory: Negative for shortness of breath.   Cardiovascular: Negative for chest pain.  Gastrointestinal: Negative for abdominal pain, constipation, blood in stool and melena.  Genitourinary: Negative for dysuria.  Musculoskeletal: Negative for falls.  Skin: Negative for rash.  Neurological: Negative for dizziness, loss of consciousness and weakness.       Chronic left facial droop  Endo/Heme/Allergies:       Diabetes  Psychiatric/Behavioral: Positive for memory loss. Negative for depression. The patient is not nervous/anxious and does not have insomnia.     Health Maintenance  Topic Date Due  . OPHTHALMOLOGY EXAM  07/05/1937  . TETANUS/TDAP  07/05/1946  . ZOSTAVAX  07/06/1987  . DEXA SCAN  07/05/1992    . HEMOGLOBIN A1C  11/17/2015  . FOOT EXAM  11/30/2015  . URINE MICROALBUMIN  11/30/2015  . INFLUENZA VACCINE  03/24/2016  . PNA vac Low Risk Adult  Completed    Physical Exam: Filed Vitals:   11/21/15 0825  BP: 122/54  Pulse: 65  Temp: 97.7 F (36.5 C)  TempSrc: Oral  Resp: 20  Height: 5\' 4"  (1.626 m)  Weight: 121 lb 12.8 oz (55.248 kg)  SpO2: 98%   Body mass index is 20.9 kg/(m^2). Physical Exam  Constitutional: She appears well-developed and well-nourished. No distress.  Cardiovascular: Normal rate, regular rhythm, normal heart sounds and intact distal pulses.   Pulmonary/Chest: Effort normal and breath sounds normal. No respiratory distress.  Abdominal: Bowel sounds are normal.  Musculoskeletal: Normal range of motion.  Neurological: She is alert.  Chronic left facial droop  Skin: Skin is warm and dry.  Psychiatric: She has a normal mood and affect.    Labs reviewed: Basic Metabolic Panel:  Recent Labs  04/02/15 1127 05/20/15 1138 06/10/15 1156  NA 141 140 141  K 5.5* 5.3* 5.1  CL 103 102 102  CO2 25 24 23   GLUCOSE 76 133* 125*  BUN 38* 47* 43*  CREATININE 1.20* 1.14* 1.02*  CALCIUM 9.0 9.0 8.8   Liver Function Tests:  Recent Labs  04/02/15 1127 06/10/15 1156  AST 14 19  ALT 15 23  ALKPHOS 78 83  BILITOT 0.5 0.5  PROT 6.5 6.4  ALBUMIN 3.9 3.7   No results for input(s): LIPASE, AMYLASE in the last 8760 hours. No results for input(s): AMMONIA in the last 8760 hours. CBC:  Recent Labs  04/02/15 1127 05/20/15 1138 06/10/15 1156  WBC 11.8* 8.6 8.8  NEUTROABS 7.1* 4.4 5.5  HCT 43.6 44.7 43.1  MCV 89 93 89  PLT 163 180 177   Lipid Panel: No results for input(s): CHOL, HDL, LDLCALC, TRIG, CHOLHDL, LDLDIRECT in the last 8760 hours. Lab Results  Component Value Date   HGBA1C 6.1* 05/20/2015    Assessment/Plan 1. Type 2 diabetes mellitus with neurological manifestations, controlled ([place]) -does frequent checks b/c with her dementia her  granddaughter has trouble telling if she is having a high or low to modify her diet - Blood Glucose Monitoring Suppl (ACCU-CHEK AVIVA) device; Check blood sugar three times daily as directed E11.49  Dispense: 2 each; Refill: 11  2. Vascular dementia without behavioral disturbance -cont aricept and plavix -eats a healthy diet and does get some exercise -cont current mgt  3.  Stress -recently her son has had some psychiatric problems and her granddaughter is worried about how this might affect her -she seems to be doing ok today, but they will let me know if her behavior changes  4. Hyperlipidemia -cont zetia for her cholesterol which she tolerates well due to her vascular etiology to her dementia  5. Essential hypertension, benign -bp well controlled without medications at this time with NAS diet  Labs/tests ordered:  Orders Placed This Encounter  Procedures  . Hemoglobin A1c  . Basic metabolic panel    Next appt:  3 mos for med mgt  Esaul Dorwart L. Soundra Lampley, D.O. [facility] [address], [place] [address] Cell Phone (Mon-Fri 8am-5pm):  [phone] On Call:  [phone] & follow prompts after 5pm & weekends Office Phone:  [phone] Office Fax:  [phone]

## 2015-11-22 LAB — BASIC METABOLIC PANEL
BUN/Creatinine Ratio: 33 — ABNORMAL HIGH (ref 11–26)
BUN: 36 mg/dL — ABNORMAL HIGH (ref 8–27)
CO2: 24 mmol/L (ref 18–29)
Calcium: 9 mg/dL (ref 8.7–10.3)
Chloride: 101 mmol/L (ref 96–106)
Creatinine, Ser: 1.09 mg/dL — ABNORMAL HIGH (ref 0.57–1.00)
GFR calc Af Amer: 52 mL/min/{1.73_m2} — ABNORMAL LOW (ref >59)
GFR calc non Af Amer: 45 mL/min/{1.73_m2} — ABNORMAL LOW (ref >59)
Glucose: 127 mg/dL — ABNORMAL HIGH (ref 65–99)
Potassium: 5.1 mmol/L (ref 3.5–5.2)
Sodium: 139 mmol/L (ref 134–144)

## 2015-11-22 LAB — HEMOGLOBIN A1C
Est. average glucose Bld gHb Est-mCnc: 128 mg/dL
Hgb A1c MFr Bld: 6.1 % — ABNORMAL HIGH (ref 4.8–5.6)

## 2015-11-28 ENCOUNTER — Other Ambulatory Visit: Payer: Self-pay | Admitting: *Deleted

## 2015-11-28 MED ORDER — GLUCOSE BLOOD VI STRP: ORAL_STRIP | Status: DC

## 2015-11-28 NOTE — Telephone Encounter (Signed)
Daughter calling stating that the test strips should be for three times daily instead of once daily, rx corrected and sent to pharmacy.

## 2015-12-10 ENCOUNTER — Telehealth: Payer: Self-pay | Admitting: Internal Medicine

## 2015-12-10 NOTE — Telephone Encounter (Signed)
Melissa from [facility] called requesting a [facility] referral for patient. She is already established with this provider and is scheduled for a follow up on May 5th with Dr. Melton Krebs  Submitted request was approved  Authorization [id number] 12/10/15 - 06/09/1916 For 6 visits Dr. Clayburn Pert Phoncharoenfri

## 2016-01-28 ENCOUNTER — Telehealth: Payer: Self-pay

## 2016-01-28 NOTE — Telephone Encounter (Signed)
Patient's daughter called requesting antibiotic for tick bite. Patient with a tick-bite on neck and area is red. Patient's daughter denies swelling, fever, or warmth to touch. Patient finished clindamycin yesterday (prescribed in the hospital). I informed Erin Hubbard that her mother needs an appointment. Erin Hubbard mentioned that she is unable to bring her mother in because she is sick and she does not have anyone else that could facilitate an appointment with her mother due to dementia.   Patient already has a pending appointment for Thursday, I offered to schedule patient with Dr.Carter tomorrow at 9:15, if patient's daughter is well she will bring her in, if not appointment to be canceled same day with no charge and patient will keep original appointment for Thursday, Erin Hubbard agreed.   Dr.Reed please advise if you have additional recommendations or a different course of action to take place

## 2016-01-29 ENCOUNTER — Ambulatory Visit: Payer: Self-pay | Admitting: Internal Medicine

## 2016-01-29 NOTE — Telephone Encounter (Signed)
Left message on voicemail for [facility] with Dr.Reed's response

## 2016-01-29 NOTE — Telephone Encounter (Signed)
It appears her clindamycin may have protected her from acquiring lyme disease with the bite so she should be ok until I see her Thursday as long as the tick was fully removed.

## 2016-01-30 ENCOUNTER — Encounter: Payer: Self-pay | Admitting: Nurse Practitioner

## 2016-01-30 ENCOUNTER — Ambulatory Visit (INDEPENDENT_AMBULATORY_CARE_PROVIDER_SITE_OTHER): Payer: Commercial Managed Care - HMO | Admitting: Nurse Practitioner

## 2016-01-30 VITALS — BP 118/58 | HR 58 | Temp 97.8°F | Resp 17 | Ht 64.0 in | Wt 123.2 lb

## 2016-01-30 DIAGNOSIS — L03116 Cellulitis of left lower limb: Secondary | ICD-10-CM | POA: Diagnosis not present

## 2016-01-30 DIAGNOSIS — T148 Other injury of unspecified body region: Secondary | ICD-10-CM

## 2016-01-30 DIAGNOSIS — W57XXXA Bitten or stung by nonvenomous insect and other nonvenomous arthropods, initial encounter: Secondary | ICD-10-CM

## 2016-01-30 NOTE — Progress Notes (Signed)
Patient ID: Erin Hubbard, female   DOB: 09/29/1924, 82 y.o.   MRN: [id number]     PCP: Hollace Kinnier, DO  Advanced Directive information Does patient have an advance directive?: Yes, Type of Advance Directive: Healthcare Power of Attorney  Allergies  Allergen Reactions  . Penicillins     Chief Complaint  Patient presents with  . Medical Management of Chronic Issues    tick bite on back of neck. unsure of how long tick was attached but tick was jellybean sized.   . OTHER    Granddaughter, Joellen Jersey, in room with patient     HPI: Patient is a 82 y.o. female seen in the office today for hospital follow up. Pt was hospitalized at [facility] from 01/19/16-01/20/16. Here with granddaughter. Pt was hospitalized due to cellulitis in left leg. Was treated doxycycline and Bactroban prior to hospitalization with minimal results. Treated with IV clindamycin in hospital with improvement and cont on PO antibiotics on discharge More recently (2 days ago) found another tick on her neck. Size of a jelly bean, that had been attached for a little while. Went to urgent care and was placed on doxycycline for preventative. No fevers or chill. No rash noted. Feels well at this time. Good appetite. Tolerating doxycyline without side effects    Review of Systems:  Review of Systems  Constitutional: Negative for fever, chills, activity change and appetite change.  Respiratory: Negative for cough and shortness of breath.   Cardiovascular: Negative for chest pain.  Gastrointestinal: Negative for nausea, vomiting, abdominal pain, constipation and blood in stool.  Genitourinary: Negative for dysuria.  Skin: Negative for color change, rash and wound.  Neurological: Negative for dizziness and weakness.       Chronic left facial droop  Hematological:       Diabetes    Past Medical History  Diagnosis Date  . Hyperlipidemia   . Diabetes mellitus without complication ([place])   . Hypotension   . Stroke ([place])   . Vascular  dementia without behavioral disturbance   . History of recurrent UTI (urinary tract infection)   . Benign parotid tumor 1986-1987    s/p resection x 2  . Gallbladder disorder   . Memory loss   . Intracerebral hemorrhage ([place])   . Gait disturbance   . Confusion     04/19/14:EEG showed abnormal with mild to moderate slow wave over the left frontotemporal area and mild slow wave abnormality in the right temporal region without sz activity    Past Surgical History  Procedure Laterality Date  . Tonsillectomy      childhood  . Cataract extraction  2012    Dr. Bing Plume  . Removal partoid tumor  1986   Social History:   reports that she has never smoked. She has never used smokeless tobacco. She reports that she does not drink alcohol or use illicit drugs.  Family History  Problem Relation Age of Onset  . Heart disease Mother   . Heart disease Father   . High blood pressure Sister   . High Cholesterol Sister   . Heart disease Sister   . Polymyositis Daughter   . Diabetes Daughter   . Stroke Other     aunt    Medications: Patient's Medications  New Prescriptions   No medications on file  Previous Medications   ARTIFICIAL TEAR OINTMENT (LUBRICANT EYE) OINT    Apply to eye at bedtime.   BLOOD GLUCOSE MONITORING SUPPL (ACCU-CHEK AVIVA) DEVICE    Check  blood sugar three times daily as directed E11.49   CLOPIDOGREL (PLAVIX) 75 MG TABLET    Take 1 tablet (75 mg total) by mouth daily.   DONEPEZIL (ARICEPT) 5 MG TABLET    Take 1 tablet (5 mg total) by mouth at bedtime.   DOXYCYCLINE (VIBRAMYCIN) 100 MG CAPSULE    Take 100 mg by mouth. Stop date: 02/07/2016   EZETIMIBE (ZETIA) 10 MG TABLET    Take one tablet by mouth once daily for cholesterol   GLUCOSE BLOOD (ACCU-CHEK AVIVA) TEST STRIP    Use as instructed to test blood sugar three times daily dx. E11.49   LANCETS ULTRA FINE MISC    Accu-chek Aviva lancets, check blood sugar once daily as directed DX E11.49   PANTOPRAZOLE (PROTONIX) 40 MG  TABLET    Take one tablet by mouth twice daily with food for stomach   POLYETHYL GLYCOL-PROPYL GLYCOL 0.4-0.3 % SOLN    Apply to eye.  Modified Medications   No medications on file  Discontinued Medications   No medications on file     Physical Exam:  Filed Vitals:   01/30/16 1007  BP: 118/58  Pulse: 58  Temp: 97.8 F (36.6 C)  TempSrc: Oral  Resp: 17  Height: 5\' 4"  (1.626 m)  Weight: 123 lb 3.2 oz (55.883 kg)  SpO2: 97%   Body mass index is 21.14 kg/(m^2).  Physical Exam  Constitutional: She appears well-developed and well-nourished. No distress.  Cardiovascular: Normal rate, regular rhythm, normal heart sounds and intact distal pulses.   Pulmonary/Chest: Effort normal and breath sounds normal. No respiratory distress.  Abdominal: Bowel sounds are normal.  Musculoskeletal: Normal range of motion.  Neurological: She is alert.  Chronic left facial droop  Skin: Skin is warm and dry.  Left leg with small scabbed area to proximal calf. No swelling or erythema. Left posterior neck with small pencil easer sized erythema, no tenderness or heat noted.   Psychiatric: She has a normal mood and affect.    Labs reviewed: Basic Metabolic Panel:  Recent Labs  05/20/15 1138 06/10/15 1156 11/21/15 0941  NA 140 141 139  K 5.3* 5.1 5.1  CL 102 102 101  CO2 24 23 24   GLUCOSE 133* 125* 127*  BUN 47* 43* 36*  CREATININE 1.14* 1.02* 1.09*  CALCIUM 9.0 8.8 9.0   Liver Function Tests:  Recent Labs  04/02/15 1127 06/10/15 1156  AST 14 19  ALT 15 23  ALKPHOS 78 83  BILITOT 0.5 0.5  PROT 6.5 6.4  ALBUMIN 3.9 3.7   No results for input(s): LIPASE, AMYLASE in the last 8760 hours. No results for input(s): AMMONIA in the last 8760 hours. CBC:  Recent Labs  04/02/15 1127 05/20/15 1138 06/10/15 1156  WBC 11.8* 8.6 8.8  NEUTROABS 7.1* 4.4 5.5  HCT 43.6 44.7 43.1  MCV 89 93 89  PLT 163 180 177   Lipid Panel: No results for input(s): CHOL, HDL, LDLCALC, TRIG, CHOLHDL,  LDLDIRECT in the last 8760 hours. TSH: No results for input(s): TSH in the last 8760 hours. A1C: Lab Results  Component Value Date   HGBA1C 6.1* 11/21/2015     Assessment/Plan 1. Tick bite -cont on doxycycline BID for 10 days.  -to monitor for rash, increased redness, swelling and notify and seek medication attention  2. Cellulitis of left lower extremity Improved.    Carlos American. Harle Battiest  [facility] [phone] 8 am - 5 pm) [phone] (after hours)

## 2016-02-10 ENCOUNTER — Other Ambulatory Visit: Payer: Self-pay | Admitting: *Deleted

## 2016-02-10 MED ORDER — EZETIMIBE 10 MG PO TABS: ORAL_TABLET | ORAL | Status: DC

## 2016-02-10 MED ORDER — CLOPIDOGREL BISULFATE 75 MG PO TABS: 75.0000 mg | ORAL_TABLET | Freq: Every day | ORAL | Status: DC

## 2016-02-10 MED ORDER — PANTOPRAZOLE SODIUM 40 MG PO TBEC: DELAYED_RELEASE_TABLET | ORAL | Status: DC

## 2016-02-10 NOTE — Telephone Encounter (Signed)
[place] daughter requested and faxed to pharmacy.

## 2016-02-10 NOTE — Telephone Encounter (Signed)
[facility] 

## 2016-02-20 ENCOUNTER — Encounter: Payer: Self-pay | Admitting: Internal Medicine

## 2016-02-20 ENCOUNTER — Ambulatory Visit (INDEPENDENT_AMBULATORY_CARE_PROVIDER_SITE_OTHER): Payer: Commercial Managed Care - HMO | Admitting: Internal Medicine

## 2016-02-20 VITALS — BP 110/58 | HR 59 | Temp 98.0°F | Ht 64.0 in | Wt 122.0 lb

## 2016-02-20 DIAGNOSIS — I10 Essential (primary) hypertension: Secondary | ICD-10-CM | POA: Diagnosis not present

## 2016-02-20 DIAGNOSIS — F0150 Vascular dementia without behavioral disturbance: Secondary | ICD-10-CM

## 2016-02-20 DIAGNOSIS — E785 Hyperlipidemia, unspecified: Secondary | ICD-10-CM | POA: Diagnosis not present

## 2016-02-20 DIAGNOSIS — K219 Gastro-esophageal reflux disease without esophagitis: Secondary | ICD-10-CM

## 2016-02-20 DIAGNOSIS — L03116 Cellulitis of left lower limb: Secondary | ICD-10-CM

## 2016-02-20 DIAGNOSIS — E1149 Type 2 diabetes mellitus with other diabetic neurological complication: Secondary | ICD-10-CM | POA: Diagnosis not present

## 2016-02-20 HISTORY — DX: Essential (primary) hypertension: I10

## 2016-02-20 MED ORDER — DONEPEZIL HCL 5 MG PO TABS: 5.0000 mg | ORAL_TABLET | Freq: Every day | ORAL | Status: DC

## 2016-02-20 NOTE — Progress Notes (Addendum)
Location:  [facility] clinic Provider:  Jontay Maston L. Mariea Clonts, D.O., C.M.D.  Code Status: DNR Goals of Care:  Advanced Directives 02/20/2016  Does patient have an advance directive? Yes  Type of Advance Directive Healthcare Power of Attorney  Copy of advanced directive(s) in chart? -     Chief Complaint  Patient presents with  . Medical Management of Chronic Issues    11mth follow-up    HPI: Patient is a 82 y.o. female seen today for medical management of chronic diseases.   Things are stable.    Cellulitis of leg after tick bite resolved with medication. Was on neck and previous on left leg.  Right posterior neck was the other location.  She and her sister are supposed to do tick checks.  Still staying with her sister.    DMII:  Typically doing well.  Had an odd sugar yesterday of 155.  Normally no higher than 130s.  Was 37 this am.  Sometimes time of food changes and that will affect it.  1 hr difference in time of meal may have affected it.    Cholesterol:  On zetia.   Bowels are moving well.  No diarrhea.    GERD:  No recent indigestion.  Audiology appt was rescheduled due to granddaughter's illness.  Memory:  About the same.  Continues on aricept.    Recheck bp 110/58  Past Medical History  Diagnosis Date  . Hyperlipidemia   . Diabetes mellitus without complication ([place])   . Hypotension   . Stroke ([place])   . Vascular dementia without behavioral disturbance   . History of recurrent UTI (urinary tract infection)   . Benign parotid tumor 1986-1987    s/p resection x 2  . Gallbladder disorder   . Memory loss   . Intracerebral hemorrhage ([place])   . Gait disturbance   . Confusion     04/19/14:EEG showed abnormal with mild to moderate slow wave over the left frontotemporal area and mild slow wave abnormality in the right temporal region without sz activity     Past Surgical History  Procedure Laterality Date  . Tonsillectomy      childhood  . Cataract extraction  2012   Dr. Bing Plume  . Removal partoid tumor  1986    Allergies  Allergen Reactions  . Penicillins       Medication List       This list is accurate as of: 02/20/16  9:10 AM.  Always use your most recent med list.               ACCU-CHEK AVIVA device  Check blood sugar three times daily as directed E11.49     clopidogrel 75 MG tablet  Commonly known as:  PLAVIX  Take 1 tablet (75 mg total) by mouth daily.     donepezil 5 MG tablet  Commonly known as:  ARICEPT  Take 1 tablet (5 mg total) by mouth at bedtime.     ezetimibe 10 MG tablet  Commonly known as:  ZETIA  Take one tablet by mouth once daily for cholesterol     glucose blood test strip  Commonly known as:  ACCU-CHEK AVIVA  Use as instructed to test blood sugar three times daily dx. E11.49     LANCETS ULTRA FINE Misc  Accu-chek Aviva lancets, check blood sugar once daily as directed DX E11.49     LUBRICANT EYE Oint  Apply to eye at bedtime.     pantoprazole 40 MG tablet  Commonly known as:  PROTONIX  Take one tablet by mouth twice daily with food for stomach     Polyethyl Glycol-Propyl Glycol 0.4-0.3 % Soln  Apply to eye.        Review of Systems:  Review of Systems  Constitutional: Negative for fever and chills.  HENT: Positive for hearing loss.   Eyes:       Drooped left eye, cannot close with facial palsy  Respiratory: Negative for shortness of breath.   Cardiovascular: Negative for chest pain, palpitations and leg swelling.  Gastrointestinal: Negative for abdominal pain, diarrhea, constipation, blood in stool and melena.  Genitourinary: Negative for dysuria, urgency and frequency.  Musculoskeletal: Negative for myalgias and falls.       Walks with cane  Skin: Negative for itching and rash.       Tick bites healed  Neurological: Negative for dizziness and loss of consciousness.  Endo/Heme/Allergies: Bruises/bleeds easily.  Psychiatric/Behavioral: Positive for memory loss. Negative for depression.     Health Maintenance  Topic Date Due  . OPHTHALMOLOGY EXAM  07/05/1937  . TETANUS/TDAP  07/05/1946  . DEXA SCAN  07/05/1992  . FOOT EXAM  11/30/2015  . URINE MICROALBUMIN  11/30/2015  . ZOSTAVAX  02/19/2017 (Originally 07/06/1987)  . INFLUENZA VACCINE  03/24/2016  . HEMOGLOBIN A1C  05/23/2016  . PNA vac Low Risk Adult  Completed    Physical Exam: Filed Vitals:   02/20/16 0842  BP: 138/62  Pulse: 59  Temp: 98 F (36.7 C)  TempSrc: Oral  Height: 5\' 4"  (1.626 m)  Weight: 122 lb (55.339 kg)  SpO2: 97%   Body mass index is 20.93 kg/(m^2). Physical Exam  Constitutional: She appears well-developed and well-nourished.  Cardiovascular: Normal rate, regular rhythm, normal heart sounds and intact distal pulses.   Pulmonary/Chest: Effort normal and breath sounds normal. No respiratory distress.  Abdominal: Soft. Bowel sounds are normal. She exhibits no distension and no mass. There is no tenderness.  Musculoskeletal: Normal range of motion.  Neurological: She is alert.  Left facial palsy; oriented to person place and rough time  Skin: Skin is warm and dry.  Psychiatric: She has a normal mood and affect.    Labs reviewed: Basic Metabolic Panel:  Recent Labs  05/20/15 1138 06/10/15 1156 11/21/15 0941  NA 140 141 139  K 5.3* 5.1 5.1  CL 102 102 101  CO2 24 23 24   GLUCOSE 133* 125* 127*  BUN 47* 43* 36*  CREATININE 1.14* 1.02* 1.09*  CALCIUM 9.0 8.8 9.0   Liver Function Tests:  Recent Labs  04/02/15 1127 06/10/15 1156  AST 14 19  ALT 15 23  ALKPHOS 78 83  BILITOT 0.5 0.5  PROT 6.5 6.4  ALBUMIN 3.9 3.7   No results for input(s): LIPASE, AMYLASE in the last 8760 hours. No results for input(s): AMMONIA in the last 8760 hours. CBC:  Recent Labs  04/02/15 1127 05/20/15 1138 06/10/15 1156  WBC 11.8* 8.6 8.8  NEUTROABS 7.1* 4.4 5.5  HCT 43.6 44.7 43.1  MCV 89 93 89  PLT 163 180 177   Lipid Panel: No results for input(s): CHOL, HDL, LDLCALC, TRIG,  CHOLHDL, LDLDIRECT in the last 8760 hours. Lab Results  Component Value Date   HGBA1C 6.1* 11/21/2015    Assessment/Plan 1. Cellulitis of left lower extremity -resolved with abx post tick bite and walking better again  2. Type 2 diabetes mellitus with neurological manifestations, controlled ([place]) -glucose has been well controlled, tries to stick with a  routine and regular snacks for hypoglycemia -f/u hba1c in Oct at appt  3. Vascular dementia without behavioral disturbance - donepezil (ARICEPT) 5 MG tablet; Take 1 tablet (5 mg total) by mouth at bedtime.  Dispense: 90 tablet; Refill: 1 - has been stable  4. Hyperlipidemia - cont zetia therapy and healthy diet  5. Essential hypertension, benign -bp at goal, stays on low side w/o meds, but no dizziness or falls  6. Gastroesophageal reflux disease, esophagitis presence not specified-stable with PPI  Labs/tests ordered:  Will check hba1c, flp at next visit (to come fasting) Next appt:  06/25/2016  Angelo Caroll L. Binta Statzer, D.O. [facility] [address], [place] [address] Cell Phone (Mon-Fri 8am-5pm):  [phone] On Call:  [phone] & follow prompts after 5pm & weekends Office Phone:  [phone] Office Fax:  [phone]

## 2016-04-06 ENCOUNTER — Telehealth: Payer: Self-pay | Admitting: *Deleted

## 2016-04-06 NOTE — Telephone Encounter (Signed)
Sounds more like a stomach bug than an anxiety/stress reaction to me.  I think it would probably be better for her granddaughter to be with her than her sister at this point so she can be monitored more closely.  I recommend adequate hydration with water and plenty of rest.  If GI symptoms continue and she's not able to keep things down, she'll need to be seen urgently at urgent care or ED.  (Not sure why she couldn't have been seen earlier today when there were a bunch of openings with me and her NP?  Now I'm full Thursday and Friday.)

## 2016-04-06 NOTE — Telephone Encounter (Signed)
LMOM to return call.

## 2016-04-06 NOTE — Telephone Encounter (Signed)
Grand daughter, [name] called and stated that patient's son was involuntary committed yesterday and it has affected patient. About 3-4 this morning patient went to the bathroom and went into a cold sweat and collapsed. Came to and started crying and started vomiting with diarrhea. Patient's sister stated that she got better after that and went back to bed and vitals were fine. They have made an appointment for 8/24 but feels like this may be to far out and would like your advise.

## 2016-04-07 NOTE — Telephone Encounter (Signed)
Granddaughter is unable to stay with her due to her father's mental illness but will have someone watch her closer. Will keep appointment next Thursday to follow up.

## 2016-04-16 ENCOUNTER — Encounter: Payer: Self-pay | Admitting: Internal Medicine

## 2016-04-16 ENCOUNTER — Ambulatory Visit (INDEPENDENT_AMBULATORY_CARE_PROVIDER_SITE_OTHER): Payer: Commercial Managed Care - HMO | Admitting: Internal Medicine

## 2016-04-16 VITALS — BP 110/70 | HR 57 | Temp 98.1°F | Wt 124.0 lb

## 2016-04-16 DIAGNOSIS — I69391 Dysphagia following cerebral infarction: Secondary | ICD-10-CM | POA: Diagnosis not present

## 2016-04-16 DIAGNOSIS — F0150 Vascular dementia without behavioral disturbance: Secondary | ICD-10-CM | POA: Diagnosis not present

## 2016-04-16 DIAGNOSIS — E1149 Type 2 diabetes mellitus with other diabetic neurological complication: Secondary | ICD-10-CM

## 2016-04-16 NOTE — Progress Notes (Signed)
Location:  [facility] clinic Provider: Lucca Ballo L. Mariea Clonts, D.O., C.M.D.  Code Status: DNR Goals of Care:  Advanced Directives 04/16/2016  Does patient have an advance directive? Yes  Type of Advance Directive [place]  Does patient want to make changes to advanced directive? -  Copy of advanced directive(s) in chart? Yes  Pre-existing out of facility DNR order (yellow form or pink MOST form) -   Chief Complaint  Patient presents with  . Acute Visit    anxiety   HPI: Patient is a 82 y.o. female seen today for an acute visit for anxiety.  Her son was involuntarily committed.  He has vascular dementia with behavioral disturbances.  He is back home now.  They need social work help.    She admits to stress with him.  She says her appetite is ok.  She passed out the one night and did not go to the ED for workup.  She had gotten sick to her stomach, fell off of the bed, passed out.  That was when he was first committed.    There are difficulties with glucose management at her sister's home.  She is not getting her CBGs on time.  Her sister also has an alcohol problem and that impacts things.  She is also not getting meals on time.  Her CBGs have been all over.  Some 70s-80s, but not lower than that.  Normally higher than 130 means a problem and she's been in the 160s.      Past Medical History:  Diagnosis Date  . 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 Confusion    04/19/14:EEG showed abnormal with mild to moderate slow wave over the left frontotemporal area and mild slow wave abnormality in the right temporal region without sz activity   . Diabetes mellitus without complication ([place])   . Essential hypertension, benign 02/20/2016  . Gait disturbance   . Gallbladder disorder   . History of recurrent UTI (urinary tract infection)   . Hyperlipidemia   . Hypotension   . Intracerebral hemorrhage ([place])   . Memory loss   . Stroke ([place])   . Vascular dementia without  behavioral disturbance     Past Surgical History:  Procedure Laterality Date  . CATARACT EXTRACTION  2012   Dr. Bing Plume  . removal partoid tumor  1986  . TONSILLECTOMY     childhood    Allergies  Allergen Reactions  . Penicillins       Medication List       Accurate as of 04/16/16 10:19 AM. Always use your most recent med list.          ACCU-CHEK AVIVA device Check blood sugar three times daily as directed E11.49   clopidogrel 75 MG tablet Commonly known as:  PLAVIX Take 1 tablet (75 mg total) by mouth daily.   donepezil 5 MG tablet Commonly known as:  ARICEPT Take 1 tablet (5 mg total) by mouth at bedtime.   ezetimibe 10 MG tablet Commonly known as:  ZETIA Take one tablet by mouth once daily for cholesterol   glucose blood test strip Commonly known as:  ACCU-CHEK AVIVA Use as instructed to test blood sugar three times daily dx. E11.49   LANCETS ULTRA FINE Misc Accu-chek Aviva lancets, check blood sugar once daily as directed DX E11.49   LUBRICANT EYE Oint Apply to eye at bedtime.   pantoprazole 40 MG tablet Commonly known as:  PROTONIX Take  one tablet by mouth twice daily with food for stomach   Polyethyl Glycol-Propyl Glycol 0.4-0.3 % Soln Apply to eye.       Review of Systems:  Review of Systems  Constitutional: Negative for chills, fever and malaise/fatigue.  HENT: Positive for hearing loss.   Eyes: Negative for blurred vision.  Respiratory: Negative for cough and shortness of breath.   Cardiovascular: Negative for chest pain, palpitations and leg swelling.  Gastrointestinal: Negative for abdominal pain, blood in stool, constipation and melena.  Genitourinary: Negative for dysuria, frequency and urgency.  Musculoskeletal: Negative for falls.  Skin: Negative for itching and rash.  Neurological: Positive for loss of consciousness. Negative for dizziness and weakness.       Had syncopal episode after using restroom  Psychiatric/Behavioral:  Positive for memory loss. Negative for depression. The patient is not nervous/anxious and does not have insomnia.        Pt denies, but her granddaughter notes it    Health Maintenance  Topic Date Due  . OPHTHALMOLOGY EXAM  07/05/1937  . TETANUS/TDAP  07/05/1946  . DEXA SCAN  07/05/1992  . URINE MICROALBUMIN  11/30/2015  . INFLUENZA VACCINE  03/24/2016  . ZOSTAVAX  02/19/2017 (Originally 07/06/1987)  . HEMOGLOBIN A1C  05/23/2016  . FOOT EXAM  02/19/2017  . PNA vac Low Risk Adult  Completed    Physical Exam: Vitals:   04/16/16 1012  BP: 110/70  Pulse: (!) 57  Temp: 98.1 F (36.7 C)  TempSrc: Oral  SpO2: 96%  Weight: 124 lb (56.2 kg)   Body mass index is 21.28 kg/m. Physical Exam  Constitutional: She appears well-developed and well-nourished. No distress.  Cardiovascular: Normal rate, regular rhythm, normal heart sounds and intact distal pulses.   Pulmonary/Chest: Effort normal and breath sounds normal. No respiratory distress.  Abdominal: Soft. Bowel sounds are normal.  Musculoskeletal: Normal range of motion.  Walks with cane, has chronic droop of left eye  Neurological: She is alert.  Skin: Skin is warm and dry. There is pallor.    Labs reviewed: Basic Metabolic Panel:  Recent Labs  05/20/15 1138 06/10/15 1156 11/21/15 0941  NA 140 141 139  K 5.3* 5.1 5.1  CL 102 102 101  CO2 24 23 24   GLUCOSE 133* 125* 127*  BUN 47* 43* 36*  CREATININE 1.14* 1.02* 1.09*  CALCIUM 9.0 8.8 9.0   Liver Function Tests:  Recent Labs  06/10/15 1156  AST 19  ALT 23  ALKPHOS 83  BILITOT 0.5  PROT 6.4  ALBUMIN 3.7   No results for input(s): LIPASE, AMYLASE in the last 8760 hours. No results for input(s): AMMONIA in the last 8760 hours. CBC:  Recent Labs  05/20/15 1138 06/10/15 1156  WBC 8.6 8.8  NEUTROABS 4.4 5.5  HCT 44.7 43.1  MCV 93 89  PLT 180 177   Lipid Panel: No results for input(s): CHOL, HDL, LDLCALC, TRIG, CHOLHDL, LDLDIRECT in the last 8760  hours. Lab Results  Component Value Date   HGBA1C 6.1 (H) 11/21/2015   Assessment/Plan 1. Type 2 diabetes mellitus with neurological manifestations, controlled ([place]) - due to worsening glucose control in context of stress and anxiety about her son's dementia diagnosis and behavioral problems - she is staying with her sister who has alcoholism and her regular treatments are not getting done due to her sister's condition - AMB Referral to [facility]  2. Dysphagia as late effect of stroke - ongoing difficulty - AMB Referral to [place] [facility]  3. Vascular dementia without behavioral disturbance - is mild, but she needs reminders, cues, help with her diabetes mgt and her sister is not capable of fully doing this - AMB Referral to [facility]   Labs/tests ordered:   Orders Placed This Encounter  Procedures  . AMB Referral to [place]    Referral Priority:   Routine    Referral Type:   Consultation    Referral Reason:   [facility]    Number of Visits Requested:   1    Next appt:  06/25/2016  Nataki Mccrumb L. Dreux Mcgroarty, D.O. [facility] [address], [place] [address] Cell Phone (Mon-Fri 8am-5pm):  [phone] On Call:  [phone] & follow prompts after 5pm & weekends Office Phone:  [phone] Office Fax:  [phone]

## 2016-04-23 ENCOUNTER — Other Ambulatory Visit: Payer: Self-pay

## 2016-04-23 ENCOUNTER — Encounter: Payer: Self-pay | Admitting: *Deleted

## 2016-04-23 NOTE — Patient Outreach (Signed)
[facility]) Care Management  04/23/2016  Erin Hubbard 1925/01/25 [id number]   REFERRAL DATE; 04/16/16 REFERRAL SOURCE; patients primary MD REFERRAL REASON:  Needs disposition assistance because patient is living with her sister and needs more support.   CONSENT: patient/ patients granddaughter/ caregiver has durable power of attorney and health care power of attorney. Healthcare power of attorney is in patients electronic medical record.  CODE status:  Patient listed as DNR.   SOCIAL:  Patient currently living with her sister since February 2017. Patient requires ADL assistance and total IADL assist. Patients family drives her to her doctor appointments and there has to be someone that accompanies her to her appointments.  Patient has a son that lived with her who was recently involuntarily committed due to vascular dementia.     SUBJECTIVE; Telephone call to patients caregiver/ HPOA, Erin Hubbard.  HIPAA verified for patient by caregiver. Discussed and offered [facility] care management services for patient.  Caregiver verbally agreed to services for patient.  Caregiver states she would like to talk with a Education officer, museum regarding community services for patient.  States patient is currently staying with her sister who is 43.  Caregiver states she is also taking care of her sick father.Caregiver states she is not able to provide continuous care to patient and needs to know about available services to assist with her care.  Caregiver states she would like to find out options for patient regarding home care services and just see what is available. Caregiver states patient has been dealing with a lot of anxiety lately due to her son being sick.  States patients son has been living with her but was recently involuntarily committed and diagnosed with Vascular dementia. Son has since returned home.   Caregiver states patient would like to return to her home to live but there is concern  regarding her safety.   Caregiver states patient is diabetic.  States patients diabetes is being controlled by diet.  Caregiver states patient checks her blood sugar 3 times per day and adheres to a strict diet.  Caregiver states she is concerned that patient does not understand when she is not ok health wise.  States patient needs additional education regarding her diabetes and her diet.  Caregiver states patient also has foods that cause her to become sick very quickly and she needs to understand this.  Caregiver states patient needs assistance with incorporating  Caregiver states patient has not had any falls but stumbles frequently.Caregiver states patient needs to be encouraged to use her cane more often. Caregiver states patient has had instances in which her blood pressure has dropped from sitting to standing.   ASSESSMENT: Primary MD referral.  Patient/caregiver would benefit from social worker assistance for community resources for in home care.  Patient would benefit from community nurse assistance for home safety evaluation, evaluation of orthostatic hypertension symptoms, and disease/ nutrition management and education.    PLAN; RNCM will refer patient to community case Freight forwarder and Education officer, museum.   Quinn Plowman RN,BSN,CCM [facility] Telephonic  [phone]

## 2016-04-30 ENCOUNTER — Other Ambulatory Visit: Payer: Self-pay | Admitting: *Deleted

## 2016-05-01 NOTE — Patient Outreach (Signed)
[facility]) Care Management  05/01/2016  Erin Hubbard June 02, 1925 [id number]     CSW received a new referral on patient from Quinn Plowman, Telephonic Nurse Case Manager with [place] [facility], indicating that patient would benefit from social work services and resources to assist with arranging home care services for patient.  Patient's granddaughter, Erin Hubbard is currently patient's primary caregiver and healthcare power of attorney, and is requesting to speak with CSW about arranging sitter services for patient. CSW made an initial attempt to try and contact patient today to perform phone assessment, as well as assess and assist with social needs and services, without success.  A HIPAA complaint message was left for patient on voicemail.  CSW is currently awaiting a return call. Nat Christen, BSW, MSW, LCSW  Licensed Education officer, environmental Health System  Mailing [place] [address] [place], [place] [address] Physical Address-[address], [place], [place] [address] Toll Free Main # [phone] Fax # [phone] Cell # [phone]  Fax # [phone]  [email or web address]

## 2016-05-06 ENCOUNTER — Other Ambulatory Visit: Payer: Self-pay | Admitting: *Deleted

## 2016-05-06 NOTE — Patient Outreach (Signed)
[facility] [facility]) Care Management  05/06/2016  Erin Hubbard 01-22-1925 [id number]   Received a referral and RN attempted outreach call today to inquire further however unsuccessful. RN informed to contact the involved grandduaghter Bea Graff however only able to leave a HIPAA approved voice message requesting a call back. Will inquire further at that time on pt's needs.  Raina Mina, RN Care Management Coordinator [place] Office [phone]

## 2016-05-07 ENCOUNTER — Other Ambulatory Visit: Payer: Self-pay | Admitting: *Deleted

## 2016-05-07 NOTE — Patient Outreach (Signed)
[facility]) Care Management  05/07/2016  Bill Bonville 25-May-1925 [id number]   RN attempted second outreach call to pt's today to inquire on the recent referral for community case management services however the person of contact was not available as RN left a HIPAA approved voice message requesting a call back. Will continue to follow up once again on tomorrow or await a call back from the pt's caregiver daughter Yaniris Thach.   Raina Mina, RN Care Management Coordinator [place] Office [phone]

## 2016-05-08 ENCOUNTER — Other Ambulatory Visit: Payer: Self-pay | Admitting: *Deleted

## 2016-05-08 DIAGNOSIS — I10 Essential (primary) hypertension: Secondary | ICD-10-CM

## 2016-05-08 NOTE — Patient Outreach (Signed)
[facility]) Care Management  05/08/2016  Isabelle Heeren 1924-10-11 [id number]   RN attempted another outreach call to the [place] today Lonni Thrasher (granddaughter). RN introduced the [facility] program and services and the purpose for today's call. Several topics discussed as the granddaughter indicated pt currently living with her sister next door from the pt's home. Discussed all that was referred via telephonic nurse related to following:  Diabetes-granddaughter states its more about education the current caregivers need states when the pt was in her care this was not a problem. RN offered printable education information related to healthy eating habits related to a diabetes limited carbohydrates ect.. Granddaughter states she is aware that the pt needs to eat every few hours and be constant with her food items. States the pt is not on any diabetic medications at this time however needs specific dietary measures. Encouraged a nutritional or a dietitian for a more specific guideline of food items choose for this individual pt that is more structured for this pt. Granddaughter feels this maybe best at this time.  Safety-RN offered home safety evaluation and discussed removal of rugs and hazard area in the home to be adjust for a safe environment. Night lights throughout the home especially at night and easy access to the main areas of the home and more frequently used areas such as the bathrooms. Also discussed DME in the home such as a 3-1 commode that can be used at the bedside to avoid trips to the bathroom throughout the night. Granddaughter feels the pt's home is safer then the pt's sister home but feels the pt needs assistance 24/7 to be safe.  Note level of care discussed based upon the pt's current level of care (assisted living) and facilities that offer such care (receptive to more information for the future if considered).  Medications- feels this is not an issues but receptive to  review of the pt's medications for educational purpose for the sister who is currently the primary caregiver. RN also inquired on pt's eye drops however granddaughter feels they just need to know when is the best time to administer the eye drops. Will pose the question to [place] upon consultation or consult with the provider recommendations.  Orthostatic BP- RN discussed pt's BP and how this may involve pt's volume level. Granddaughter indicated the problem does involve pt's volume level are low and pt needs to "drink more water".  RN offered once again to get involved with a home visit to monitor pt's BP over the next few months however feels this is not necessary at this time. RN stress the importance of pt upon arising from a sitting position to pause and wait a few seconds prior to movement with her ambulation to avoid falls or dizziness (granddaughter agreed) and will continue to stress this to the sister and pt.   RN again offered home visits for a more face-to-face management of care however granddaughter feels this is not necessary at this time and was more interested in having a Education officer, museum consult for details on community resources for both the pt and her spouse. RN will make a referral for [facility] social worker and pharmacy to consult with this caregiver and remove the discipline from the care team at this time and notify primary provider accordingly. No other inquires or request at this time.  Raina Mina, RN Care Management Coordinator [place] Office [phone]

## 2016-05-11 ENCOUNTER — Other Ambulatory Visit: Payer: Self-pay | Admitting: *Deleted

## 2016-05-11 NOTE — Patient Outreach (Signed)
[facility]) Care Management  05/11/2016  Preya [place] [birth year] [id number]   CSW was able to make brief contact with patient's granddaughter, Teasha Kubacki today to try and perform the initial assessment on patient, as well as assess and assist with social work needs and services.  According to Quinn Plowman, Telephonic Nurse Case Manager with [place] [facility], patient's granddaughter and [place], Threasa Rockholt is interested in speaking with CSW about options for patient with regards to home care services.   Ms. Mcgatha indicated that she was sleeping at the time of CSW's call, requesting to return CSW's call on Tuesday, September 19th.  CSW was able to ensure that Ms. Wade has the correct contact information for CSW.  CSW will await a return call. Nat Christen, BSW, MSW, LCSW  Licensed Education officer, environmental Health System  Mailing [place] [address], [place], [place] [address] Physical Address-[address], [place], [place] [address] Toll Free Main # [phone] Fax # [phone] Cell # [phone]  Fax # [phone]  [email or web address]

## 2016-05-20 ENCOUNTER — Other Ambulatory Visit: Payer: Self-pay | Admitting: *Deleted

## 2016-05-20 ENCOUNTER — Encounter: Payer: Self-pay | Admitting: *Deleted

## 2016-05-20 NOTE — Patient Outreach (Signed)
[facility] Care Management  05/20/2016  Erin Hubbard 05-03-1925 [id number]   CSW was able to make initial contact with patient's granddaughter, Erin Hubbard today to perform the initial phone assessment, as well as assess and assist with social work needs and services.  CSW introduced self, explained role and types of services provided through [employer].  CSW further explained to Mrs. Koenigs that CSW works with patient's RNCM, also with [place] [facility], Raina Mina.  CSW obtained two HIPAA compliant identifiers from Mrs. Scala, which included patient's name and date of birth. Mrs. Pounds admitted that she is currently dealing with her own health issues, in the midst of trying to care for her son with special needs, her father with Dementia with Behavioral Disturbances and her grandmother, also with Dementia.  Mrs. Lovan indicated that she could really use some help in the home with trying to care for her son, father and grandmother, with whom all live with Mrs. Vader.  CSW agreed to mail Mrs. Gartner a list of agencies that provide respite care services in the home.  In addition, CSW will mail Mrs. Schlossberg brochures on ACE ([facility]) and PACE (Program of [place] [address] for the Elderly).  Mrs. Scarfone is interested in attending support groups for caregivers with Dementia.  Therefore, CSW will mail Mrs. Ericksen brochures for the [employer], [place] and [employer] of [place].  Last, Mrs. Bahar is interested in getting her grandmother and her father connected with [employer], so CSW agreed to make the referral on their behalf, as well as mail Mrs. Leinbach information about the program.  CSW was able to ensure that Mrs. Alfredo has the correct contact information for CSW, encouraging her to contact CSW if she has additional questions or needs further assistance.  Mrs. Bains  voiced understanding and was agreeable to this plan. CSW will perform a case closure on patient, as all goals of treatment have been met from social work standpoint and no additional social work needs have been identified at this time.  CSW will notify patient's RNCM with [place] [facility], Raina Mina of CSW's plans to close patient's case.  CSW will fax an update to patient's Primary Care Physician, Dr. Hollace Kinnier to ensure that they are aware of CSW's involvement with patient's plan of care.  CSW will submit a case closure request to Verlon Setting, Care Management Assistant with [place] [facility], in the form of an [employer].  CSW will ensure that Mrs. Comer is aware of Raina Mina, RNCM with [place] [facility], continued involvement with patient's care. Nat Christen, BSW, MSW, LCSW  Licensed Education officer, environmental Health System  Mailing [place] [address], [place], [place] [address] Physical Address-[address], [place], [place] [address] Toll Free Main # [phone] Fax # [phone] Cell # [phone]  Fax # [phone]  [email or web address]

## 2016-05-21 NOTE — Patient Outreach (Signed)
[place] [facility]) Care Management  05/21/2016  Erin Hubbard 1925/02/21 [id number]   Request received from Nat Christen, LCSW to mail patient community resources. Resources mailed today 05/21/16.   [name]  [facility] Care Management Assistant

## 2016-06-18 ENCOUNTER — Other Ambulatory Visit: Payer: Self-pay

## 2016-06-18 NOTE — Patient Outreach (Signed)
05/2616  Erin Hubbard [birth year] [id number]  Subjective: Erin Hubbard is an 63 YOF referred to [place] for medication education and review via Tobi Bastos, RN. Contact requested to be only with the HPOA granddaughter [name]. Quay Burow, [place] reached out to the granddaughter.  I reviewed Valery Longman's medications and Crista provided contact information for patient assistance if/when [name] needs financial assistance with her medications.  Objective:  Current Medications:  Artificial tear ointment: apply to eye qhs Clopidogrel 75 mg: 1 tab qday Donepezil 5 mg: 1 tab qday. Pt takes qhs to avoid dizziness Ezetimibe 10 mg: 1 tab qday for cholesterol Pantoprazole 40 mg: 1 tab bid with food for stomach Polyethyl glycol-propyl glycol 0.4-0.3% solution: apply to eye  Allergies: pcns (unknown - confirmed via phone 06/08/16)  Assessment:  Drugs sorted by system:  Neurologic/Psychologic: donepezil  Cardiovascular: ezetimibe, clopidogrel (post-strokes, most recent 2003)  Pulmonary/Allergy: none reported  Gastrointestinal: pantoprazole  Endocrine: none reported  Renal: none reported  Topical: none reported  Pain: none reported  Ocular: artificial tears, polyethyl glycol-propyl glycol  Miscellaneous: none reported   Duplications in therapy: none found  Gaps in therapy: Pt has DM and hx of strokes, but is not on a statin; however, recommendation for may not be appropriate at this time. Pt's daughter states she is unsure if pt has been on statin before. Last FLP in 2015 and met TC/TG/LDL/HDL goals.  Medications to avoid in the elderly: none reported  Drug interactions: none reported  Other issues noted: Pt has T2DM, which is controlled through diet; however, diet is currently not controlled due to social situation. Pt currently tests BG TID before meals.  Plan: 1. No major problems or drug interactions noted.  Continue current  regimen is recommended.  2. Visit neurology MD as scheduled within next couple weeks.  3. I will close the case to pharmacy due to all the pharmacy issues being resolved at this time.  I am happy to assist in the future if any pharmacy issues arise.    Deanne Coffer, PharmD, Museum/gallery curator of [employer]  [phone]

## 2016-06-24 ENCOUNTER — Other Ambulatory Visit: Payer: Self-pay

## 2016-06-24 DIAGNOSIS — E1149 Type 2 diabetes mellitus with other diabetic neurological complication: Secondary | ICD-10-CM

## 2016-06-25 ENCOUNTER — Encounter: Payer: Self-pay | Admitting: Internal Medicine

## 2016-06-25 ENCOUNTER — Ambulatory Visit (INDEPENDENT_AMBULATORY_CARE_PROVIDER_SITE_OTHER): Payer: Commercial Managed Care - HMO | Admitting: Internal Medicine

## 2016-06-25 ENCOUNTER — Ambulatory Visit (INDEPENDENT_AMBULATORY_CARE_PROVIDER_SITE_OTHER): Payer: Commercial Managed Care - HMO

## 2016-06-25 VITALS — BP 122/58 | HR 51 | Temp 97.5°F | Ht 64.0 in | Wt 125.0 lb

## 2016-06-25 VITALS — BP 122/58 | HR 51 | Temp 97.5°F | Ht 64.0 in | Wt 125.8 lb

## 2016-06-25 DIAGNOSIS — Z780 Asymptomatic menopausal state: Secondary | ICD-10-CM

## 2016-06-25 DIAGNOSIS — Z23 Encounter for immunization: Secondary | ICD-10-CM

## 2016-06-25 DIAGNOSIS — F0150 Vascular dementia without behavioral disturbance: Secondary | ICD-10-CM

## 2016-06-25 DIAGNOSIS — E1149 Type 2 diabetes mellitus with other diabetic neurological complication: Secondary | ICD-10-CM | POA: Diagnosis not present

## 2016-06-25 DIAGNOSIS — Z0000 Encounter for general adult medical examination without abnormal findings: Secondary | ICD-10-CM | POA: Diagnosis not present

## 2016-06-25 DIAGNOSIS — E7800 Pure hypercholesterolemia, unspecified: Secondary | ICD-10-CM | POA: Diagnosis not present

## 2016-06-25 DIAGNOSIS — I10 Essential (primary) hypertension: Secondary | ICD-10-CM

## 2016-06-25 LAB — HEMOGLOBIN A1C
Hgb A1c MFr Bld: 5.9 % — ABNORMAL HIGH (ref <5.7)
Mean Plasma Glucose: 123 mg/dL

## 2016-06-25 MED ORDER — GLUCOSE BLOOD VI STRP: ORAL_STRIP | 11 refills | Status: DC

## 2016-06-25 MED ORDER — DONEPEZIL HCL 5 MG PO TABS: 5.0000 mg | ORAL_TABLET | Freq: Every day | ORAL | 1 refills | Status: DC

## 2016-06-25 MED ORDER — PANTOPRAZOLE SODIUM 40 MG PO TBEC: DELAYED_RELEASE_TABLET | ORAL | 1 refills | Status: DC

## 2016-06-25 MED ORDER — EZETIMIBE 10 MG PO TABS: ORAL_TABLET | ORAL | 1 refills | Status: DC

## 2016-06-25 MED ORDER — CLOPIDOGREL BISULFATE 75 MG PO TABS: 75.0000 mg | ORAL_TABLET | Freq: Every day | ORAL | 1 refills | Status: DC

## 2016-06-25 MED ORDER — LANCETS ULTRA FINE MISC: 11 refills | Status: DC

## 2016-06-25 NOTE — Patient Instructions (Signed)
Erin Hubbard , Thank you for taking time to come for your Medicare Wellness Visit. I appreciate your ongoing commitment to your health goals. Please review the following plan we discussed and let me know if I can assist you in the future.   These are the goals we discussed: Goals    None      This is a list of the screening recommended for you and due dates:  Health Maintenance  Topic Date Due  . Eye exam for diabetics  07/05/1937  . Tetanus Vaccine  07/05/1946  . DEXA scan (bone density measurement)  07/05/1992  . Urine Protein Check  11/30/2015  . Flu Shot  03/24/2016  . Hemoglobin A1C  05/23/2016  . Shingles Vaccine  02/19/2017*  . Complete foot exam   02/19/2017  . Pneumonia vaccines  Completed  *Topic was postponed. The date shown is not the original due date.  Preventive Care for Adults  A healthy lifestyle and preventive care can promote health and wellness. Preventive health guidelines for adults include the following key practices.  . A routine yearly physical is a good way to check with your health care provider about your health and preventive screening. It is a chance to share any concerns and updates on your health and to receive a thorough exam.  . Visit your dentist for a routine exam and preventive care every 6 months. Brush your teeth twice a day and floss once a day. Good oral hygiene prevents tooth decay and gum disease.  . The frequency of eye exams is based on your age, health, family medical history, use  of contact lenses, and other factors. Follow your health care provider's ecommendations for frequency of eye exams.  . Eat a healthy diet. Foods like vegetables, fruits, whole grains, low-fat dairy products, and lean protein foods contain the nutrients you need without too many calories. Decrease your intake of foods high in solid fats, added sugars, and salt. Eat the right amount of calories for you. Get information about a proper diet from your health care  provider, if necessary.  . Regular physical exercise is one of the most important things you can do for your health. Most adults should get at least 150 minutes of moderate-intensity exercise (any activity that increases your heart rate and causes you to sweat) each week. In addition, most adults need muscle-strengthening exercises on 2 or more days a week.  Silver Sneakers may be a benefit available to you. To determine eligibility, you may visit the website: www.silversneakers.com or contact program at (617)784-3042 Mon-Fri between 8AM-8PM.   . Maintain a healthy weight. The body mass index (BMI) is a screening tool to identify possible weight problems. It provides an estimate of body fat based on height and weight. Your health care provider can find your BMI and can help you achieve or maintain a healthy weight.   For adults 20 years and older: ? A BMI below 18.5 is considered underweight. ? A BMI of 18.5 to 24.9 is normal. ? A BMI of 25 to 29.9 is considered overweight. ? A BMI of 30 and above is considered obese.   . Maintain normal blood lipids and cholesterol levels by exercising and minimizing your intake of saturated fat. Eat a balanced diet with plenty of fruit and vegetables. Blood tests for lipids and cholesterol should begin at age 45 and be repeated every 5 years. If your lipid or cholesterol levels are high, you are over 50, or you are at  high risk for heart disease, you may need your cholesterol levels checked more frequently. Ongoing high lipid and cholesterol levels should be treated with medicines if diet and exercise are not working.  . If you smoke, find out from your health care provider how to quit. If you do not use tobacco, please do not start.  . If you choose to drink alcohol, please do not consume more than 2 drinks per day. One drink is considered to be 12 ounces (355 mL) of beer, 5 ounces (148 mL) of wine, or 1.5 ounces (44 mL) of liquor.  . If you are 20-79 years  old, ask your health care provider if you should take aspirin to prevent strokes.  . Use sunscreen. Apply sunscreen liberally and repeatedly throughout the day. You should seek shade when your shadow is shorter than you. Protect yourself by wearing long sleeves, pants, a wide-brimmed hat, and sunglasses year round, whenever you are outdoors.  . Once a month, do a whole body skin exam, using a mirror to look at the skin on your back. Tell your health care provider of new moles, moles that have irregular borders, moles that are larger than a pencil eraser, or moles that have changed in shape or color.

## 2016-06-25 NOTE — Progress Notes (Signed)
Location:  [facility] clinic Provider:  Gerianne Simonet L. Mariea Clonts, D.O., C.M.D.  Code Status: DNR Goals of Care:  Advanced Directives 06/25/2016  Does patient have an advance directive? Yes  Type of Advance Directive [place]  Does patient want to make changes to advanced directive? No - Patient declined  Copy of advanced directive(s) in chart? Yes  Would patient like information on creating an advanced directive? No - patient declined information  Pre-existing out of facility DNR order (yellow form or pink MOST form) -     Chief Complaint  Patient presents with  . Medical Management of Chronic Issues    4 month check up    HPI: Patient is a 82 y.o. female seen today for medical management of chronic diseases.    Social situation still the same.  Her son has been seen and pt's sugars are still all over the place.  Her granddaughter is there as much as she can.  Her sister is not very good at maintaining her diet consistently.  Her granddaughter stays until pt goes to bed.  Her sister is still drinking. Her son still needs placement then pt can go back home.  CBGs are about consistent in 115-117.  Some 180s postprandial.    She's been more congested this am, but not before that.  No fevers, chills.    Past Medical History:  Diagnosis Date  . 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 Confusion    04/19/14:EEG showed abnormal with mild to moderate slow wave over the left frontotemporal area and mild slow wave abnormality in the right temporal region without sz activity   . Diabetes mellitus without complication ([place])   . Essential hypertension, benign 02/20/2016  . Gait disturbance   . Gallbladder disorder   . History of recurrent UTI (urinary tract infection)   . Hyperlipidemia   . Hypotension   . Intracerebral hemorrhage ([place])   . Memory loss   . Stroke ([place])   . Vascular dementia without behavioral disturbance     Past Surgical History:  Procedure  Laterality Date  . CATARACT EXTRACTION  2012   Dr. Bing Plume  . removal partoid tumor  1986  . TONSILLECTOMY     childhood    Allergies  Allergen Reactions  . Penicillins       Medication List       Accurate as of 06/25/16  8:54 AM. Always use your most recent med list.          ACCU-CHEK AVIVA device Check blood sugar three times daily as directed E11.49   clopidogrel 75 MG tablet Commonly known as:  PLAVIX Take 1 tablet (75 mg total) by mouth daily.   donepezil 5 MG tablet Commonly known as:  ARICEPT Take 1 tablet (5 mg total) by mouth at bedtime.   ezetimibe 10 MG tablet Commonly known as:  ZETIA Take one tablet by mouth once daily for cholesterol   glucose blood test strip Commonly known as:  ACCU-CHEK AVIVA Use as instructed to test blood sugar three times daily dx. E11.49   LANCETS ULTRA FINE Misc Accu-chek Aviva lancets, check blood sugar once daily as directed DX E11.49   LUBRICANT EYE Oint Apply to eye at bedtime.   pantoprazole 40 MG tablet Commonly known as:  PROTONIX Take one tablet by mouth twice daily with food for stomach   Polyethyl Glycol-Propyl Glycol 0.4-0.3 % Soln Apply to eye.  Review of Systems:  Review of Systems  Constitutional: Negative for chills, fever, malaise/fatigue and weight loss.  HENT: Negative for congestion.   Eyes: Negative for blurred vision.       Left dry eye  Respiratory: Negative for shortness of breath.   Cardiovascular: Negative for chest pain, palpitations and leg swelling.  Gastrointestinal: Negative for abdominal pain, blood in stool, constipation and melena.  Genitourinary: Negative for dysuria, frequency and urgency.  Musculoskeletal: Negative for falls.  Skin: Negative for itching and rash.  Neurological: Positive for focal weakness. Negative for dizziness, loss of consciousness, weakness and headaches.       Chronic facial droop  Endo/Heme/Allergies: Bruises/bleeds easily.    Psychiatric/Behavioral: Positive for memory loss. Negative for depression.    Health Maintenance  Topic Date Due  . OPHTHALMOLOGY EXAM  07/05/1937  . TETANUS/TDAP  07/05/1946  . DEXA SCAN  07/05/1992  . URINE MICROALBUMIN  11/30/2015  . INFLUENZA VACCINE  03/24/2016  . HEMOGLOBIN A1C  05/23/2016  . ZOSTAVAX  02/19/2017 (Originally 07/06/1987)  . FOOT EXAM  02/19/2017  . PNA vac Low Risk Adult  Completed    Physical Exam: Vitals:   06/25/16 0848  BP: (!) 122/58  Pulse: (!) 51  Temp: 97.5 F (36.4 C)  TempSrc: Oral  SpO2: 98%  Weight: 125 lb (56.7 kg)  Height: 5\' 4"  (1.626 m)   Body mass index is 21.46 kg/m. Physical Exam  Labs reviewed: Basic Metabolic Panel:  Recent Labs  11/21/15 0941  NA 139  K 5.1  CL 101  CO2 24  GLUCOSE 127*  BUN 36*  CREATININE 1.09*  CALCIUM 9.0   Liver Function Tests: No results for input(s): AST, ALT, ALKPHOS, BILITOT, PROT, ALBUMIN in the last 8760 hours. No results for input(s): LIPASE, AMYLASE in the last 8760 hours. No results for input(s): AMMONIA in the last 8760 hours. CBC: No results for input(s): WBC, NEUTROABS, HGB, HCT, MCV, PLT in the last 8760 hours. Lipid Panel: No results for input(s): CHOL, HDL, LDLCALC, TRIG, CHOLHDL, LDLDIRECT in the last 8760 hours. Lab Results  Component Value Date   HGBA1C 6.1 (H) 11/21/2015    Assessment/Plan 1. Type 2 diabetes mellitus with neurological manifestations, controlled ([place]) - her granddaughter is concerned that her glucose has been more erratic due to less regular meals--check hba1c - LANCETS ULTRA FINE MISC; Accu-chek Aviva lancets, check blood sugar once daily as directed DX E11.49  Dispense: 100 each; Refill: 11 - cont zetia and glucose monitoring  2. Vascular dementia without behavioral disturbance - doing well from this perspective  - donepezil (ARICEPT) 5 MG tablet; Take 1 tablet (5 mg total) by mouth at bedtime.  Dispense: 90 tablet; Refill: 1  3. Pure  hypercholesterolemia -cont zetia therapy  4. Essential hypertension, benign -bp at goal, not on meds for bp and runs on low side--would not tolerate addition of ace/arb  5. Postmenopausal estrogen deficiency -does need bone density--none on file -may be hard to get done now with her social situation  Labs/tests ordered:  Getting hba1c done today To get her flu shot with her AWV right after this  Next appt:  06/25/2016   Yamilette Garretson L. Charley Miske, D.O. [facility] [address], [place] [address] Cell Phone (Mon-Fri 8am-5pm):  [phone] On Call:  [phone] & follow prompts after 5pm & weekends Office Phone:  [phone] Office Fax:  [phone]

## 2016-06-25 NOTE — Progress Notes (Signed)
Subjective:   Erin Hubbard is a 82 y.o. female who presents for an Initial Medicare Annual Wellness Visit.  Review of Systems     Cardiac Risk Factors include: advanced age (>55men, >19 women);diabetes mellitus     Objective:    Today's Vitals   06/25/16 0920 06/25/16 0925  BP: (!) 122/58   Pulse: (!) 51   Temp: 97.5 F (36.4 C)   TempSrc: Oral   SpO2: 98%   Weight: 125 lb 12.8 oz (57.1 kg)   Height: 5\' 4"  (1.626 m)   PainSc: 0-No pain 0-No pain   Body mass index is 21.59 kg/m.   Current Medications (verified) Outpatient Encounter Prescriptions as of 06/25/2016  Medication Sig  . Artificial Tear Ointment (LUBRICANT EYE) OINT Apply to eye at bedtime.  . Blood Glucose Monitoring Suppl (ACCU-CHEK AVIVA) device Check blood sugar three times daily as directed E11.49  . clopidogrel (PLAVIX) 75 MG tablet Take 1 tablet (75 mg total) by mouth daily.  Marland Kitchen donepezil (ARICEPT) 5 MG tablet Take 1 tablet (5 mg total) by mouth at bedtime.  Marland Kitchen ezetimibe (ZETIA) 10 MG tablet Take one tablet by mouth once daily for cholesterol  . glucose blood (ACCU-CHEK AVIVA) test strip Use as instructed to test blood sugar three times daily dx. E11.49  . LANCETS ULTRA FINE MISC Accu-chek Aviva lancets, check blood sugar once daily as directed DX E11.49  . pantoprazole (PROTONIX) 40 MG tablet Take one tablet by mouth twice daily with food for stomach  . Polyethyl Glycol-Propyl Glycol 0.4-0.3 % SOLN Apply to eye.  . [DISCONTINUED] clopidogrel (PLAVIX) 75 MG tablet Take 1 tablet (75 mg total) by mouth daily.  . [DISCONTINUED] donepezil (ARICEPT) 5 MG tablet Take 1 tablet (5 mg total) by mouth at bedtime.  . [DISCONTINUED] ezetimibe (ZETIA) 10 MG tablet Take one tablet by mouth once daily for cholesterol  . [DISCONTINUED] glucose blood (ACCU-CHEK AVIVA) test strip Use as instructed to test blood sugar three times daily dx. E11.49  . [DISCONTINUED] LANCETS ULTRA FINE MISC Accu-chek Aviva lancets, check blood  sugar once daily as directed DX E11.49  . [DISCONTINUED] pantoprazole (PROTONIX) 40 MG tablet Take one tablet by mouth twice daily with food for stomach   No facility-administered encounter medications on file as of 06/25/2016.     Allergies (verified) Penicillins   History: Past Medical History:  Diagnosis Date  . 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 Confusion    04/19/14:EEG showed abnormal with mild to moderate slow wave over the left frontotemporal area and mild slow wave abnormality in the right temporal region without sz activity   . Diabetes mellitus without complication ([place])   . Essential hypertension, benign 02/20/2016  . Gait disturbance   . Gallbladder disorder   . History of recurrent UTI (urinary tract infection)   . Hyperlipidemia   . Hypotension   . Intracerebral hemorrhage ([place])   . Memory loss   . Stroke ([place])   . Vascular dementia without behavioral disturbance    Past Surgical History:  Procedure Laterality Date  . CATARACT EXTRACTION  2012   Dr. Bing Plume  . removal partoid tumor  1986  . TONSILLECTOMY     childhood   Family History  Problem Relation Age of Onset  . Heart disease Mother   . Heart disease Father   . High blood pressure Sister   . High Cholesterol Sister   . Heart disease Sister   . Polymyositis Daughter   .  Diabetes Daughter   . Stroke Other     aunt  . Vascular Disease Son    Social History   Occupational History  . Not on file.   Social History Main Topics  . Smoking status: Never Smoker  . Smokeless tobacco: Never Used  . Alcohol use No  . Drug use: No  . Sexual activity: No    Tobacco Counseling Counseling given: No   Activities of Daily Living In your present state of health, do you have any difficulty performing the following activities: 06/25/2016 05/20/2016  Hearing? Tempie Donning  Vision? N Y  Difficulty concentrating or making decisions? Tempie Donning  Walking or climbing stairs? N Y  Dressing or bathing? N Y    Doing errands, shopping? Tempie Donning  Preparing Food and eating ? Y Y  Using the Toilet? N Y  In the past six months, have you accidently leaked urine? N Y  Do you have problems with loss of bowel control? N N  Managing your Medications? Y Y  Managing your Finances? Tempie Donning  Housekeeping or managing your Housekeeping? Tempie Donning  Some recent data might be hidden    Immunizations and Health Maintenance Immunization History  Administered Date(s) Administered  . Influenza,inj,Quad PF,36+ Mos 06/28/2014, 05/20/2015, 06/25/2016  . Influenza-Unspecified 05/26/2012  . Pneumococcal Conjugate-13 11/30/2014  . Pneumococcal Polysaccharide-23 07/29/2011   There are no preventive care reminders to display for this patient.  Patient Care Team: Gayland Curry, DO as PCP - General (Geriatric Medicine)  Indicate any recent Medical Services you may have received from other than [facility] providers in the past year (date may be approximate).     Assessment:   This is a routine wellness examination for Erin Hubbard.   Hearing/Vision screen Hearing Screening Comments: Pt is hard of hearing and has bilateral hearing aids but states she needs new batteries.  Vision Screening Comments: Pt is due for eye exam. Granddaughter states it has been several years.   Dietary issues and exercise activities discussed: Current Exercise Habits: The patient does not participate in regular exercise at present  Goals    . Increase water intake          Starting 06/25/16, I will attempt to increase my water intake by 2 more glasses per day.       Depression Screen PHQ 2/9 Scores 06/25/2016 05/20/2016 04/16/2016 02/20/2016 01/30/2016 02/18/2015 11/30/2014  PHQ - 2 Score 0 0 0 0 0 0 0    Fall Risk Fall Risk  06/25/2016 05/20/2016 04/16/2016 02/20/2016 01/30/2016  Falls in the past year? Yes Yes No No No  Number falls in past yr: 2 or more 2 or more - - -  Injury with Fall? Yes Yes - - -  Risk Factor Category  High Fall Risk High Fall Risk - - -   Risk for fall due to : History of fall(s);Impaired balance/gait;Impaired mobility;Impaired vision;Mental status change History of fall(s);Impaired balance/gait;Impaired mobility;Impaired vision;Mental status change - - -  Follow up Falls prevention discussed;Education provided Education provided;Falls prevention discussed - - -    Cognitive Function: MMSE - Mini Mental State Exam 06/25/2016 11/30/2014  Orientation to time 3 2  Orientation to Place 3 5  Registration 3 3  Attention/ Calculation 5 5  Recall 3 2  Language- name 2 objects 2 2  Language- repeat 1 1  Language- follow 3 step command 3 3  Language- read & follow direction 1 1  Write a sentence 1 1  Copy  design 1 1  Total score 26 26        Screening Tests Health Maintenance  Topic Date Due  . TETANUS/TDAP  10/21/2016 (Originally 07/05/1944)  . URINE MICROALBUMIN  01/21/2017 (Originally 11/30/2015)  . DEXA SCAN  01/21/2017 (Originally 07/05/1992)  . ZOSTAVAX  02/19/2017 (Originally 07/06/1987)  . OPHTHALMOLOGY EXAM  08/20/2017 (Originally 07/05/1935)  . HEMOGLOBIN A1C  12/23/2016  . FOOT EXAM  02/19/2017  . INFLUENZA VACCINE  Completed  . PNA vac Low Risk Adult  Completed      Plan:    I have personally reviewed and addressed the Medicare Annual Wellness questionnaire and have noted the following in the patient's chart:  A. Medical and social history B. Use of alcohol, tobacco or illicit drugs  C. Current medications and supplements D. Functional ability and status E.  Nutritional status F.  Physical activity G. Advance directives H. List of other physicians I.  Hospitalizations, surgeries, and ER visits in previous 12 months J.  [place] to include hearing, vision, cognitive, depression L. Referrals and appointments - none  In addition, I have reviewed and discussed with patient certain preventive protocols, quality metrics, and best practice recommendations. A written personalized care plan for  preventive services as well as general preventive health recommendations were provided to patient.  See attached scanned questionnaire for additional information.   Signed,   Allyn Kenner, LPN Health Advisor  I reviewed health advisor's note, was available for consultation and agree with the assessment and plan as written.    Hosteen Kienast L. Serine Kea, D.O. [facility] [address], [place] [address] Cell Phone (Mon-Fri 8am-5pm):  [phone] On Call:  [phone] & follow prompts after 5pm & weekends Office Phone:  [phone] Office Fax:  [phone]

## 2016-06-26 ENCOUNTER — Encounter: Payer: Self-pay | Admitting: *Deleted

## 2016-10-23 ENCOUNTER — Ambulatory Visit: Payer: Commercial Managed Care - HMO | Admitting: Internal Medicine

## 2016-11-27 ENCOUNTER — Other Ambulatory Visit: Payer: Self-pay | Admitting: *Deleted

## 2016-11-27 DIAGNOSIS — F0150 Vascular dementia without behavioral disturbance: Secondary | ICD-10-CM

## 2016-11-27 MED ORDER — PANTOPRAZOLE SODIUM 40 MG PO TBEC: DELAYED_RELEASE_TABLET | ORAL | 0 refills | Status: DC

## 2016-11-27 MED ORDER — DONEPEZIL HCL 5 MG PO TABS: 5.0000 mg | ORAL_TABLET | Freq: Every day | ORAL | 0 refills | Status: DC

## 2016-11-27 MED ORDER — CLOPIDOGREL BISULFATE 75 MG PO TABS: 75.0000 mg | ORAL_TABLET | Freq: Every day | ORAL | 0 refills | Status: DC

## 2016-11-27 NOTE — Telephone Encounter (Signed)
[employer]

## 2016-11-27 NOTE — Telephone Encounter (Signed)
Engineer, [employer]

## 2016-12-01 ENCOUNTER — Other Ambulatory Visit: Payer: Self-pay | Admitting: Internal Medicine

## 2016-12-10 ENCOUNTER — Telehealth: Payer: Self-pay

## 2016-12-10 MED ORDER — GLUCOSE BLOOD VI STRP: ORAL_STRIP | 11 refills | Status: DC

## 2016-12-10 NOTE — Telephone Encounter (Signed)
I spoke with patient's daughter, [facility] states they faxed [place] a form to be completed last week in order for patient to receive DM supplies.   I reviewed records and we have not received anything from [facility].  [name] also states she thinks they need a new rx, [facility] was bought out by [employer].  I submitted a new rx, I also called [facility] and left message informing them if there is a form we need to complete to fax, I confirmed fax number

## 2016-12-10 NOTE — Telephone Encounter (Signed)
Message left on clinical intake voicemail yesterday @ 4:35 pm:   Patient's daughter is requesting a return call to discuss diabetic test strips and pharmacy issues.

## 2016-12-11 ENCOUNTER — Telehealth: Payer: Self-pay

## 2016-12-11 DIAGNOSIS — E1149 Type 2 diabetes mellitus with other diabetic neurological complication: Secondary | ICD-10-CM

## 2016-12-11 DIAGNOSIS — R69 Illness, unspecified: Secondary | ICD-10-CM | POA: Diagnosis not present

## 2016-12-11 MED ORDER — GLUCOSE BLOOD VI STRP: ORAL_STRIP | 11 refills | Status: DC

## 2016-12-11 MED ORDER — ONETOUCH DELICA LANCETS 33G MISC: 11 refills | Status: DC

## 2016-12-11 MED ORDER — ONETOUCH ULTRA 2 W/DEVICE KIT: PACK | 0 refills | Status: DC

## 2016-12-11 NOTE — Telephone Encounter (Signed)
Matt with [facility] called yesterday to request a new meter, test strips, and lancets due to a formulary change with patient's insurance.  Matt indicated that One Touch Ultra is preferred. [employer] sent

## 2016-12-22 DIAGNOSIS — R69 Illness, unspecified: Secondary | ICD-10-CM | POA: Diagnosis not present

## 2016-12-31 ENCOUNTER — Ambulatory Visit (INDEPENDENT_AMBULATORY_CARE_PROVIDER_SITE_OTHER): Payer: Medicare HMO | Admitting: Nurse Practitioner

## 2016-12-31 ENCOUNTER — Encounter: Payer: Self-pay | Admitting: Nurse Practitioner

## 2016-12-31 VITALS — BP 112/62 | HR 62 | Temp 98.1°F | Resp 17 | Ht 64.0 in | Wt 132.0 lb

## 2016-12-31 DIAGNOSIS — W57XXXA Bitten or stung by nonvenomous insect and other nonvenomous arthropods, initial encounter: Secondary | ICD-10-CM | POA: Diagnosis not present

## 2016-12-31 DIAGNOSIS — R21 Rash and other nonspecific skin eruption: Secondary | ICD-10-CM

## 2016-12-31 MED ORDER — DOXYCYCLINE HYCLATE 100 MG PO TABS: 100.0000 mg | ORAL_TABLET | Freq: Two times a day (BID) | ORAL | 0 refills | Status: DC

## 2016-12-31 NOTE — Patient Instructions (Signed)
Neosporin consists of three different antibiotics, neomycin sulfate, polymixin B sulfate and bacitracin. Polysporin is a combination of two antibiotics, bacitracin and polymixin B sulfate. ... Since many people are allergic to neomycin, it may be best to use a topical antibiotic that does not contain this ingredient.

## 2016-12-31 NOTE — Progress Notes (Addendum)
Careteam: Patient Care Team: Erin Curry, DO as PCP - General (Geriatric Medicine)  Advanced Directive information Does Patient Have a Medical Advance Directive?: Yes, Type of Advance Directive: Healthcare Power of Attorney  Allergies  Allergen Reactions  . Penicillins     Chief Complaint  Patient presents with  . Acute Visit    Pt is being seen due to a tick bite on mid back. Tick was removed 1 day ago but unsure of how long it was attached.      HPI: Patient is a 83 y.o. female seen in the office today due to tick being removed. Here with granddaughter.  Reports her sister pulled tick off her yesterday. In between the size of a "regular tick" and a seed tick. Does not feel like it was a deer tick. Does not know how long it was on, was not "full," but pt has gotten very sick from cellulitis from tick bites in the past and even requiring hospitalization. No fever or chills noted. No rash noted.   Review of Systems:  Review of Systems  Constitutional: Negative for activity change, appetite change, chills and fever.  Respiratory: Negative for cough and shortness of breath.   Cardiovascular: Negative for chest pain.  Genitourinary: Negative for dysuria.  Skin: Positive for color change (around tick bite). Negative for rash and wound.  Neurological: Negative for dizziness and weakness.       Chronic left facial droop  Hematological:       Diabetes    Past Medical History:  Diagnosis Date  . 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 Confusion    04/19/14:EEG showed abnormal with mild to moderate slow wave over the left frontotemporal area and mild slow wave abnormality in the right temporal region without sz activity   . Diabetes mellitus without complication ([place])   . Essential hypertension, benign 02/20/2016  . Gait disturbance   . Gallbladder disorder   . History of recurrent UTI (urinary tract infection)   . Hyperlipidemia   . Hypotension   .  Intracerebral hemorrhage ([place])   . Memory loss   . Stroke ([place])   . Vascular dementia without behavioral disturbance    Past Surgical History:  Procedure Laterality Date  . CATARACT EXTRACTION  2012   Dr. Bing Plume  . removal partoid tumor  1986  . TONSILLECTOMY     childhood   Social History:   reports that she has never smoked. She has never used smokeless tobacco. She reports that she does not drink alcohol or use drugs.  Family History  Problem Relation Age of Onset  . Heart disease Mother   . Heart disease Father   . High blood pressure Sister   . High Cholesterol Sister   . Heart disease Sister   . Polymyositis Daughter   . Diabetes Daughter   . Stroke Other        aunt  . Vascular Disease Son     Medications: Patient's Medications  New Prescriptions   No medications on file  Previous Medications   ARTIFICIAL TEAR OINTMENT (LUBRICANT EYE) OINT    Apply to eye at bedtime.   BLOOD GLUCOSE MONITORING SUPPL (ONE TOUCH ULTRA 2) W/DEVICE KIT    Check blood sugar 3-4 times daily DX E11.49   CLOPIDOGREL (PLAVIX) 75 MG TABLET    Take 1 tablet (75 mg total) by mouth daily. Patient needs an appointment before anymore refills   DONEPEZIL (ARICEPT)  5 MG TABLET    Take 1 tablet (5 mg total) by mouth at bedtime. Patient needs an appointment before anymore refills   EZETIMIBE (ZETIA) 10 MG TABLET    Take one tablet by mouth once daily for cholesterol   GLUCOSE BLOOD (ONE TOUCH ULTRA TEST) TEST STRIP    Check blood sugar 3-4 times daily DX: R51.88   ONETOUCH DELICA LANCETS 41Y MISC    Check blood sugar 3-4 times daily DX E11.49   PANTOPRAZOLE (PROTONIX) 40 MG TABLET    Take one tablet by mouth twice daily with food for stomach. Patient needs appointment before anymore refills.   POLYETHYL GLYCOL-PROPYL GLYCOL 0.4-0.3 % SOLN    Apply to eye.  Modified Medications   No medications on file  Discontinued Medications   No medications on file     Physical Exam:  Vitals:   12/31/16  1140  BP: 112/62  Pulse: 62  Resp: 17  Temp: 98.1 F (36.7 C)  TempSrc: Oral  SpO2: 95%  Weight: 132 lb (59.9 kg)  Height: 5' 4"  (1.626 m)   Body mass index is 22.66 kg/m.  Physical Exam  Constitutional: She appears well-developed and well-nourished. No distress.  Cardiovascular: Normal rate, regular rhythm, normal heart sounds and intact distal pulses.   Pulmonary/Chest: Effort normal and breath sounds normal. No respiratory distress.  Abdominal: Soft. Bowel sounds are normal.  Musculoskeletal: Normal range of motion.  Walks with cane, has chronic droop of left eye  Neurological: She is alert.  Skin: Skin is warm and dry. There is erythema (noted around tick bite to left mid back to the right of spine). There is pallor.    Labs reviewed: Basic Metabolic Panel: No results for input(s): NA, K, CL, CO2, GLUCOSE, BUN, CREATININE, CALCIUM, MG, PHOS, TSH in the last 8760 hours. Liver Function Tests: No results for input(s): AST, ALT, ALKPHOS, BILITOT, PROT, ALBUMIN in the last 8760 hours. No results for input(s): LIPASE, AMYLASE in the last 8760 hours. No results for input(s): AMMONIA in the last 8760 hours. CBC: No results for input(s): WBC, NEUTROABS, HGB, HCT, MCV, PLT in the last 8760 hours. Lipid Panel: No results for input(s): CHOL, HDL, LDLCALC, TRIG, CHOLHDL, LDLDIRECT in the last 8760 hours. TSH: No results for input(s): TSH in the last 8760 hours. A1C: Lab Results  Component Value Date   HGBA1C 5.9 (H) 06/25/2016     Assessment/Plan 1. Tick bite, initial encounter with redness  - doxycycline (VIBRA-TABS) 100 MG tablet; Take 1 tablet (100 mg total) by mouth 2 (two) times daily.  Dispense: 14 tablet; Refill: 0 -to notify and monitor for fevers, rash, worsening of redness Follow up as needed  Javarion Douty K. Harle Battiest  [facility] [phone] 8 am - 5 pm) [phone] (after hours)

## 2017-01-13 DIAGNOSIS — R69 Illness, unspecified: Secondary | ICD-10-CM | POA: Diagnosis not present

## 2017-01-17 DIAGNOSIS — E119 Type 2 diabetes mellitus without complications: Secondary | ICD-10-CM | POA: Diagnosis not present

## 2017-01-17 DIAGNOSIS — R29810 Facial weakness: Secondary | ICD-10-CM | POA: Diagnosis not present

## 2017-01-17 DIAGNOSIS — R51 Headache: Secondary | ICD-10-CM | POA: Diagnosis not present

## 2017-01-17 DIAGNOSIS — R112 Nausea with vomiting, unspecified: Secondary | ICD-10-CM | POA: Diagnosis not present

## 2017-01-17 DIAGNOSIS — Z880 Allergy status to penicillin: Secondary | ICD-10-CM | POA: Diagnosis not present

## 2017-01-17 DIAGNOSIS — R030 Elevated blood-pressure reading, without diagnosis of hypertension: Secondary | ICD-10-CM | POA: Diagnosis not present

## 2017-01-17 DIAGNOSIS — R69 Illness, unspecified: Secondary | ICD-10-CM | POA: Diagnosis not present

## 2017-01-17 DIAGNOSIS — Z79899 Other long term (current) drug therapy: Secondary | ICD-10-CM | POA: Diagnosis not present

## 2017-01-17 DIAGNOSIS — I10 Essential (primary) hypertension: Secondary | ICD-10-CM | POA: Diagnosis not present

## 2017-01-27 ENCOUNTER — Telehealth: Payer: Self-pay

## 2017-01-27 DIAGNOSIS — R413 Other amnesia: Secondary | ICD-10-CM | POA: Diagnosis not present

## 2017-01-27 DIAGNOSIS — I639 Cerebral infarction, unspecified: Secondary | ICD-10-CM | POA: Diagnosis not present

## 2017-01-27 DIAGNOSIS — I611 Nontraumatic intracerebral hemorrhage in hemisphere, cortical: Secondary | ICD-10-CM | POA: Diagnosis not present

## 2017-01-27 NOTE — Telephone Encounter (Signed)
Patient's granddaughter called requesting an appointment today with Dr.Reed for hallucinations.   I informed patient Dr.Reed is out of office and no available appointments today with in office providers. Joellen Jersey will call patient's neurologist to see if patient can be seen by them today.  Patient was seen recently 01/17/17 in the ER and told to schedule a follow up with PCP. Patient's B/P was elevated at the ER and has been ok since. Current B/P reading 136/60, no SOB, or signs of distress away from hallucinations.  Katie requested that I send Dr.Reed a FYI message that her grandmother is hallucinating, patient's thinks she cremated her husband while he was alive.   ER follow-up scheduled for tomorrow @ 1:30 with Dr.Reed

## 2017-01-28 ENCOUNTER — Encounter: Payer: Self-pay | Admitting: Internal Medicine

## 2017-01-28 ENCOUNTER — Ambulatory Visit (INDEPENDENT_AMBULATORY_CARE_PROVIDER_SITE_OTHER): Payer: Medicare HMO | Admitting: Internal Medicine

## 2017-01-28 VITALS — BP 102/58 | HR 62 | Temp 97.6°F | Ht 64.0 in | Wt 133.0 lb

## 2017-01-28 DIAGNOSIS — H3500 Unspecified background retinopathy: Secondary | ICD-10-CM | POA: Diagnosis not present

## 2017-01-28 DIAGNOSIS — N179 Acute kidney failure, unspecified: Secondary | ICD-10-CM | POA: Diagnosis not present

## 2017-01-28 DIAGNOSIS — D72829 Elevated white blood cell count, unspecified: Secondary | ICD-10-CM | POA: Diagnosis not present

## 2017-01-28 DIAGNOSIS — F0150 Vascular dementia without behavioral disturbance: Secondary | ICD-10-CM | POA: Diagnosis not present

## 2017-01-28 DIAGNOSIS — R69 Illness, unspecified: Secondary | ICD-10-CM | POA: Diagnosis not present

## 2017-01-28 DIAGNOSIS — W57XXXS Bitten or stung by nonvenomous insect and other nonvenomous arthropods, sequela: Secondary | ICD-10-CM

## 2017-01-28 DIAGNOSIS — R443 Hallucinations, unspecified: Secondary | ICD-10-CM | POA: Diagnosis not present

## 2017-01-28 DIAGNOSIS — E1149 Type 2 diabetes mellitus with other diabetic neurological complication: Secondary | ICD-10-CM

## 2017-01-28 LAB — CBC WITH DIFFERENTIAL/PLATELET
Basophils Absolute: 87 cells/uL (ref 0–200)
Basophils Relative: 1 %
Eosinophils Absolute: 435 cells/uL (ref 15–500)
Eosinophils Relative: 5 %
HCT: 43.4 % (ref 35.0–45.0)
Hemoglobin: 14.2 g/dL (ref 11.7–15.5)
Lymphocytes Relative: 30 %
Lymphs Abs: 2610 cells/uL (ref 850–3900)
MCH: 29.5 pg (ref 27.0–33.0)
MCHC: 32.7 g/dL (ref 32.0–36.0)
MCV: 90 fL (ref 80.0–100.0)
MPV: 10.4 fL (ref 7.5–12.5)
Monocytes Absolute: 1131 cells/uL — ABNORMAL HIGH (ref 200–950)
Monocytes Relative: 13 %
Neutro Abs: 4437 cells/uL (ref 1500–7800)
Neutrophils Relative %: 51 %
Platelets: 177 10*3/uL (ref 140–400)
RBC: 4.82 MIL/uL (ref 3.80–5.10)
RDW: 13 % (ref 11.0–15.0)
WBC: 8.7 10*3/uL (ref 3.8–10.8)

## 2017-01-28 LAB — BASIC METABOLIC PANEL
BUN: 45 mg/dL — ABNORMAL HIGH (ref 7–25)
CO2: 26 mmol/L (ref 20–31)
Calcium: 8.6 mg/dL (ref 8.6–10.4)
Chloride: 107 mmol/L (ref 98–110)
Creat: 1.17 mg/dL — ABNORMAL HIGH (ref 0.60–0.88)
Glucose, Bld: 131 mg/dL — ABNORMAL HIGH (ref 65–99)
Potassium: 5.6 mmol/L — ABNORMAL HIGH (ref 3.5–5.3)
Sodium: 137 mmol/L (ref 135–146)

## 2017-01-28 NOTE — Telephone Encounter (Signed)
Patient seen Neurologist yesterday, pending appointment with Dr.Reed today

## 2017-01-28 NOTE — Progress Notes (Signed)
Location:  [facility] clinic Provider: Celena Lanius L. Mariea Clonts, D.O., C.M.D.  Code Status: DNR Goals of Care:  Advanced Directives 12/31/2016  Does Patient Have a Medical Advance Directive? Yes  Type of Advance Directive [place]  Does patient want to make changes to medical advance directive? -  Copy of [place] in Chart? Yes  Would patient like information on creating a medical advance directive? -  Pre-existing out of facility DNR order (yellow form or pink MOST form) -     Chief Complaint  Patient presents with  . Follow-up    ER follow up from 01/17/17,seen for nausea and elevated blood pressure. Here with Joellen Jersey (Granddaughter)   . Health Maintenance    Refer to eye doctor (Dr.Digby), discuss need for BMD, Hold off on all immunizations for now. Patient will update other HM concerns at another time, patient's granddaughter Joellen Jersey would like acute concerns addressed first   . Medication Refill    No refills today unless Dr.Crisanto Nied thinks patient should take doxycycline     HPI: Patient is a 83 y.o. female seen today for an acute visit for several acute concerns.  She has a h/o vascular dementia, diabetes, hyperlipidemia, htn, prior left parotid tumor (benign) with residual facial droop upon removal, and prior stroke.  She's recently been having hallucinations.    Before 5/27, they'd been here with the tick bites.  Didn't get abx for the potential lyme.  No changes in tick bites.  A couple of times between houses in the car, she's crying the whole time.  She was talking about doing something her husband wanted her to do .  Later she said she had him cremated and she wasn't sure that was the right decision.  ON 5/27, pt and her sister were having it out that pt thought her husband was cremated and he was not actually deceased yet.  Got hot and sweaty, was nauseous.  BP was 209/72 with pt's cuff, then 182/52.  Went to the ED, bp staying 170s-180s much of the time at  the hospital. Urinalysis negative.  They said she had elevated WBC and lymphocytes.  CT brain showed no new changes.  They suggested she take the abx for the tickbite after all.  Pt's sister called Katie yesterday and said that she thought she was moving back home (that was never said to her).  She was packing her bags.  BP was 130s at that time.    Neurologist is checking if seizures with lyme tests and EEG.  She's had several ticks.  He also brought up mood as a possibility.  She still has not taken the antibiotics despite several recommendations to do so.    CBGs have been "elevated" with different meter--higher in am 116-130, at lunch upper 80s low 90s, 145 or higher at supper.  Not clear she's getting the snacks when documented by pt's sister.  Weight creeping up.  I have explained on several occasions that these sugars are not high in someone 83 years old at risk for hypoglycemia.      Past Medical History:  Diagnosis Date  . 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 Confusion    04/19/14:EEG showed abnormal with mild to moderate slow wave over the left frontotemporal area and mild slow wave abnormality in the right temporal region without sz activity   . Diabetes mellitus without complication ([place])   . Essential hypertension, benign 02/20/2016  .  Gait disturbance   . Gallbladder disorder   . History of recurrent UTI (urinary tract infection)   . Hyperlipidemia   . Hypotension   . Intracerebral hemorrhage ([place])   . Memory loss   . Stroke ([place])   . Vascular dementia without behavioral disturbance     Past Surgical History:  Procedure Laterality Date  . CATARACT EXTRACTION  2012   Dr. Bing Plume  . removal partoid tumor  1986  . TONSILLECTOMY     childhood    Allergies  Allergen Reactions  . Penicillins     Allergies as of 01/28/2017      Reactions   Penicillins       Medication List       Accurate as of 01/28/17  2:10 PM. Always use your most recent med list.            clopidogrel 75 MG tablet Commonly known as:  PLAVIX Take 1 tablet (75 mg total) by mouth daily. Patient needs an appointment before anymore refills   donepezil 5 MG tablet Commonly known as:  ARICEPT Take 1 tablet (5 mg total) by mouth at bedtime. Patient needs an appointment before anymore refills   doxycycline 100 MG tablet Commonly known as:  VIBRA-TABS Take 1 tablet (100 mg total) by mouth 2 (two) times daily.   ezetimibe 10 MG tablet Commonly known as:  ZETIA Take one tablet by mouth once daily for cholesterol   glucose blood test strip Commonly known as:  ONE TOUCH ULTRA TEST Check blood sugar 3-4 times daily DX: E11.49   LUBRICANT EYE Oint Apply to eye at bedtime.   ONE TOUCH ULTRA 2 w/Device Kit Check blood sugar 3-4 times daily DX T70.01   ONETOUCH DELICA LANCETS 74B Misc Check blood sugar 3-4 times daily DX E11.49   pantoprazole 40 MG tablet Commonly known as:  PROTONIX Take one tablet by mouth twice daily with food for stomach. Patient needs appointment before anymore refills.   Polyethyl Glycol-Propyl Glycol 0.4-0.3 % Soln Apply to eye.       Review of Systems:  Review of Systems  Constitutional: Negative for chills, fever and malaise/fatigue.  HENT: Negative for congestion.   Eyes: Negative for blurred vision.  Respiratory: Negative for cough and shortness of breath.   Cardiovascular: Negative for chest pain, palpitations and leg swelling.  Gastrointestinal: Positive for nausea. Negative for abdominal pain, blood in stool, constipation, diarrhea, melena and vomiting.  Genitourinary: Negative for dysuria.  Musculoskeletal: Negative for falls.  Skin: Negative for itching and rash.  Neurological: Positive for weakness. Negative for dizziness, focal weakness and loss of consciousness.       Chronic left facial droop after parotid tumor resection  Psychiatric/Behavioral: Positive for depression, hallucinations and memory loss. The patient is not  nervous/anxious and does not have insomnia.     Health Maintenance  Topic Date Due  . DEXA SCAN  07/05/1992  . URINE MICROALBUMIN  11/30/2015  . HEMOGLOBIN A1C  12/23/2016  . OPHTHALMOLOGY EXAM  08/20/2017 (Originally 07/05/1935)  . TETANUS/TDAP  08/24/2017 (Originally 07/05/1944)  . FOOT EXAM  02/19/2017  . INFLUENZA VACCINE  03/24/2017  . PNA vac Low Risk Adult  Completed    Physical Exam: Vitals:   01/28/17 1338  BP: (!) 102/58  Pulse: 62  Temp: 97.6 F (36.4 C)  SpO2: 93%  Weight: 133 lb (60.3 kg)  Height: 5' 4"  (1.626 m)   Body mass index is 22.83 kg/m. Physical Exam  Constitutional: She appears  well-developed and well-nourished. No distress.  Cardiovascular: Normal rate, regular rhythm, normal heart sounds and intact distal pulses.   Pulmonary/Chest: Effort normal and breath sounds normal. No respiratory distress.  Abdominal: Soft. Bowel sounds are normal. She exhibits no distension. There is no tenderness.  Musculoskeletal: Normal range of motion.  Uses cane   Neurological: She is alert.  Chronic left facial droop, no new CN deficits or focality noted  Skin: Skin is warm and dry. There is pallor.  Psychiatric:  Flat affect, looking down in lap and not conversive much at all today    Labs reviewed: Basic Metabolic Panel: No results for input(s): NA, K, CL, CO2, GLUCOSE, BUN, CREATININE, CALCIUM, MG, PHOS, TSH in the last 8760 hours. Liver Function Tests: No results for input(s): AST, ALT, ALKPHOS, BILITOT, PROT, ALBUMIN in the last 8760 hours. No results for input(s): LIPASE, AMYLASE in the last 8760 hours. No results for input(s): AMMONIA in the last 8760 hours. CBC: No results for input(s): WBC, NEUTROABS, HGB, HCT, MCV, PLT in the last 8760 hours. Lipid Panel: No results for input(s): CHOL, HDL, LDLCALC, TRIG, CHOLHDL, LDLDIRECT in the last 8760 hours. Lab Results  Component Value Date   HGBA1C 5.9 (H) 06/25/2016    Procedures since last visit: No  results found.  Notes from outside systems reviewed as much as systems permit.    Assessment/Plan 1. Leukocytosis, unspecified type -this is recurrent, no clear source -again advised to take the abx for potential lyme exposure since there was uncertainty about duration ticks were on her and she had multiple bites over a few days - CBC with Differential/Platelet  2. Acute renal failure, unspecified acute renal failure type ([place]) - f/u labs to ensure resolution  -intake was uncertain at her sister's home (sister is alcoholic per daughter in law) and she had some nausea, vomiting at one point - Basic metabolic panel  3. Tick bite, sequela -again advised to take the doxycycline due to above  4. Hallucination -suspect simply due to her dementia and may be compounded by some depression concerns, as well--she's been displaced from her home due to a son's illness and now sister's home due to bug infestation -seems glucose may be low at times, as well so again encouraged regular meals and snacks and less attention to tight glucose control at 83 yo  5. Type 2 diabetes mellitus with neurological manifestations, controlled ([place]) - has been very well controlled since I've know patient over the past few years - again, encouraged regular meals to prevent hypoglycemia and less stringent control due to low glucose risks - Hemoglobin A1c  6. Vascular dementia without behavioral disturbance -likely cause of hallucinations given no other clear etiology, also getting worked up for seizures and neuro lyme by neurology which is appropriate, also  7. Retinopathy of both eyes - due for eye exam for diabetic retinopathy  - Ambulatory referral to Ophthalmology  Labs/tests ordered:   Orders Placed This Encounter  Procedures  . CBC with Differential/Platelet  . Basic metabolic panel    Order Specific Question:   Has the patient fasted?    Answer:   Yes  . Hemoglobin A1c  . Ambulatory referral to  Ophthalmology    Referral Priority:   Routine    Referral Type:   Consultation    Referral Reason:   Specialty Services Required    Requested Specialty:   Ophthalmology    Number of Visits Requested:   1    Next appt:  03/11/2017  Ardis Fullwood L. Mckinley Adelstein, D.O. [facility] [address], [place] [address] Cell Phone (Mon-Fri 8am-5pm):  [phone] On Call:  [phone] & follow prompts after 5pm & weekends Office Phone:  [phone] Office Fax:  [phone]

## 2017-01-29 LAB — HEMOGLOBIN A1C
Hgb A1c MFr Bld: 6.2 % — ABNORMAL HIGH (ref <5.7)
Mean Plasma Glucose: 131 mg/dL

## 2017-02-02 ENCOUNTER — Telehealth: Payer: Self-pay | Admitting: *Deleted

## 2017-02-02 NOTE — Telephone Encounter (Signed)
[place] daughter notified. Labs already discussed.

## 2017-02-02 NOTE — Telephone Encounter (Signed)
This is progression of her dementia. She's reaching a different stage.  As far as the labs, I already reviewed and signed them, sent to [facility].

## 2017-02-02 NOTE — Telephone Encounter (Signed)
Patient grand daughter Millenia Waldvogel called and wanted to know the results of patient's labwork and give an update on patient's condition.[place] daughter stated things have kinda progressed on their end since last OV stating that patient thinks she is going home and has gotten up and left to go to her house and not being rational. Has started wandering which is new for her. Grand daughter Stated that it has fast progressed and feels like there is something else going on with her. Please Advise.   (Reviewed last OV note 01/28/17-not closed, no assessment or plan)

## 2017-02-03 ENCOUNTER — Encounter: Payer: Self-pay | Admitting: Internal Medicine

## 2017-02-03 DIAGNOSIS — R413 Other amnesia: Secondary | ICD-10-CM | POA: Diagnosis not present

## 2017-02-04 ENCOUNTER — Encounter: Payer: Self-pay | Admitting: Nurse Practitioner

## 2017-02-04 ENCOUNTER — Ambulatory Visit (INDEPENDENT_AMBULATORY_CARE_PROVIDER_SITE_OTHER): Payer: Medicare HMO | Admitting: Nurse Practitioner

## 2017-02-04 VITALS — BP 132/58 | HR 61 | Temp 97.6°F | Resp 17 | Ht 64.0 in | Wt 132.4 lb

## 2017-02-04 DIAGNOSIS — R030 Elevated blood-pressure reading, without diagnosis of hypertension: Secondary | ICD-10-CM | POA: Diagnosis not present

## 2017-02-04 DIAGNOSIS — F0150 Vascular dementia without behavioral disturbance: Secondary | ICD-10-CM | POA: Diagnosis not present

## 2017-02-04 DIAGNOSIS — R69 Illness, unspecified: Secondary | ICD-10-CM | POA: Diagnosis not present

## 2017-02-04 DIAGNOSIS — N184 Chronic kidney disease, stage 4 (severe): Secondary | ICD-10-CM | POA: Diagnosis not present

## 2017-02-04 NOTE — Progress Notes (Signed)
Careteam: Patient Care Team: Gayland Curry, DO as PCP - General (Geriatric Medicine)  Advanced Directive information Does Patient Have a Medical Advance Directive?: Yes, Type of Advance Directive: Healthcare Power of Attorney  Allergies  Allergen Reactions  . Penicillins     Chief Complaint  Patient presents with  . Acute Visit    Pt is being seen due to elevated BP x several weeks. Grandaughter reports that patient's bp was 185/62  . Other    Granddaughter and great granddaughter in room      HPI: Patient is a 83 y.o. female seen in the office today due to blood pressure.   Granddaughter took blood pressure because she did not look like she felt good.  Upper numbers 180s/50-60s. No chest pains, shortness of breath, numbness, headaches or mental status changes noted with elevated blood pressure.  Normal blood pressures 110-120s/50-60s Neurologist thought that seizures may be causing spikes in blood pressure.  Also doing blood work for lyme's disease due to tick bites.   Having increase anxiety at times. Having wandering episodes.  Going to see the neurologist next week.  Diet is good. Low sodium  No constipation or diarrhea.  Review of Systems: provided by chart, granddaughter and pt Review of Systems  Constitutional: Negative for chills, fever, malaise/fatigue and weight loss.  HENT: Negative for congestion.   Eyes: Negative for blurred vision.       Left dry eye  Respiratory: Negative for shortness of breath.   Cardiovascular: Negative for chest pain, palpitations and leg swelling.  Gastrointestinal: Negative for abdominal pain, blood in stool, constipation and melena.  Genitourinary: Negative for dysuria, frequency and urgency.  Musculoskeletal: Negative for falls.  Skin: Negative for itching and rash.  Neurological: Positive for focal weakness. Negative for dizziness, loss of consciousness, weakness and headaches.       Chronic facial droop    Endo/Heme/Allergies: Bruises/bleeds easily.  Psychiatric/Behavioral: Positive for memory loss. Negative for depression.    Past Medical History:  Diagnosis Date  . 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 Confusion    04/19/14:EEG showed abnormal with mild to moderate slow wave over the left frontotemporal area and mild slow wave abnormality in the right temporal region without sz activity   . Diabetes mellitus without complication ([place])   . Essential hypertension, benign 02/20/2016  . Gait disturbance   . Gallbladder disorder   . History of recurrent UTI (urinary tract infection)   . Hyperlipidemia   . Hypotension   . Intracerebral hemorrhage ([place])   . Memory loss   . Stroke ([place])   . Vascular dementia without behavioral disturbance    Past Surgical History:  Procedure Laterality Date  . CATARACT EXTRACTION  2012   Dr. Bing Plume  . removal partoid tumor  1986  . TONSILLECTOMY     childhood   Social History:   reports that she has never smoked. She has never used smokeless tobacco. She reports that she does not drink alcohol or use drugs.  Family History  Problem Relation Age of Onset  . Heart disease Mother   . Heart disease Father   . High blood pressure Sister   . High Cholesterol Sister   . Heart disease Sister   . Polymyositis Daughter   . Diabetes Daughter   . Stroke Other        aunt  . Dementia Son        Fontal Temporal Dementia (FTD)   .  Thyroid disease Grandchild        Autoimmune thyroid condition     Medications: Patient's Medications  New Prescriptions   No medications on file  Previous Medications   ARTIFICIAL TEAR OINTMENT (LUBRICANT EYE) OINT    Apply to eye at bedtime.   BLOOD GLUCOSE MONITORING SUPPL (ONE TOUCH ULTRA 2) W/DEVICE KIT    Check blood sugar 3-4 times daily DX E11.49   CLOPIDOGREL (PLAVIX) 75 MG TABLET    Take 1 tablet (75 mg total) by mouth daily. Patient needs an appointment before anymore refills   DONEPEZIL  (ARICEPT) 5 MG TABLET    Take 1 tablet (5 mg total) by mouth at bedtime. Patient needs an appointment before anymore refills   DOXYCYCLINE (VIBRA-TABS) 100 MG TABLET    Take 1 tablet (100 mg total) by mouth 2 (two) times daily.   EZETIMIBE (ZETIA) 10 MG TABLET    Take one tablet by mouth once daily for cholesterol   GLUCOSE BLOOD (ONE TOUCH ULTRA TEST) TEST STRIP    Check blood sugar 3-4 times daily DX: C58.85   ONETOUCH DELICA LANCETS 02D MISC    Check blood sugar 3-4 times daily DX E11.49   PANTOPRAZOLE (PROTONIX) 40 MG TABLET    Take one tablet by mouth twice daily with food for stomach. Patient needs appointment before anymore refills.   POLYETHYL GLYCOL-PROPYL GLYCOL 0.4-0.3 % SOLN    Apply to eye.  Modified Medications   No medications on file  Discontinued Medications   No medications on file     Physical Exam:  Vitals:   02/04/17 0907  BP: (!) 132/58  Pulse: 61  Resp: 17  Temp: 97.6 F (36.4 C)  TempSrc: Oral  SpO2: 96%  Weight: 132 lb 6.4 oz (60.1 kg)  Height: _0  (1.626 m)   Body mass index is 22.73 kg/m.  Physical Exam  Constitutional: She appears well-developed and well-nourished. No distress.  Cardiovascular: Normal rate, regular rhythm, normal heart sounds and intact distal pulses.   Pulmonary/Chest: Effort normal and breath sounds normal. No respiratory distress.  Abdominal: Soft. Bowel sounds are normal.  Musculoskeletal: She exhibits no edema.  Walks with cane, has chronic droop of left eye  Neurological: She is alert.  Skin: Skin is warm and dry. There is pallor.  Psychiatric: She has a normal mood and affect.    Labs reviewed: Basic Metabolic Panel:  Recent Labs  01/28/17 0250  NA 137  K 5.6*  CL 107  CO2 26  GLUCOSE 131*  BUN 45*  CREATININE 1.17*  CALCIUM 8.6  Estimated Creatinine Clearance: 28.1 mL/min (A) (by C-G formula based on SCr of 1.17 mg/dL (H)).  Liver Function Tests: No results for input(s): AST, ALT, ALKPHOS, BILITOT,  PROT, ALBUMIN in the last 8760 hours. No results for input(s): LIPASE, AMYLASE in the last 8760 hours. No results for input(s): AMMONIA in the last 8760 hours. CBC:  Recent Labs  01/28/17 0250  WBC 8.7  NEUTROABS 4,437  HGB 14.2  HCT 43.4  MCV 90.0  PLT 177   Lipid Panel: No results for input(s): CHOL, HDL, LDLCALC, TRIG, CHOLHDL, LDLDIRECT in the last 8760 hours. TSH: No results for input(s): TSH in the last 8760 hours. A1C: Lab Results  Component Value Date   HGBA1C 6.2 (H) 01/28/2017     Assessment/Plan 1. Transient elevated blood pressure -blood pressure generally in the 110-120/50-60 range. Elevation yesterday however came down over time. Blood pressure stable today. Education on when to  seek emergency care. To cont low sodium diet.   2. Vascular dementia without behavioral disturbance Progression of disease noted. Neurology work up being done due to acute changes per daughter in [employer].   3. CKD (chronic kidney disease) stage 4, GFR 15-29 ml/min (H[facility]) -worsening of Cr on last labs, encouraged to keep pt well hydrated which granddaughter reports has been a problem in the past. No NSAIDS or nephrotoxic medication    Asya Derryberry K. Harle Battiest  [facility] [phone] 8 am - 5 pm) [phone] (after hours)

## 2017-02-05 DIAGNOSIS — R030 Elevated blood-pressure reading, without diagnosis of hypertension: Secondary | ICD-10-CM | POA: Diagnosis not present

## 2017-02-05 DIAGNOSIS — D72829 Elevated white blood cell count, unspecified: Secondary | ICD-10-CM | POA: Diagnosis not present

## 2017-02-05 DIAGNOSIS — R4182 Altered mental status, unspecified: Secondary | ICD-10-CM | POA: Diagnosis not present

## 2017-02-05 DIAGNOSIS — I639 Cerebral infarction, unspecified: Secondary | ICD-10-CM | POA: Diagnosis not present

## 2017-02-05 DIAGNOSIS — N183 Chronic kidney disease, stage 3 (moderate): Secondary | ICD-10-CM | POA: Diagnosis not present

## 2017-02-05 DIAGNOSIS — I6523 Occlusion and stenosis of bilateral carotid arteries: Secondary | ICD-10-CM | POA: Diagnosis not present

## 2017-02-05 DIAGNOSIS — R404 Transient alteration of awareness: Secondary | ICD-10-CM | POA: Diagnosis not present

## 2017-02-05 DIAGNOSIS — R69 Illness, unspecified: Secondary | ICD-10-CM | POA: Diagnosis not present

## 2017-02-05 DIAGNOSIS — R05 Cough: Secondary | ICD-10-CM | POA: Diagnosis not present

## 2017-02-06 DIAGNOSIS — I129 Hypertensive chronic kidney disease with stage 1 through stage 4 chronic kidney disease, or unspecified chronic kidney disease: Secondary | ICD-10-CM | POA: Diagnosis not present

## 2017-02-06 DIAGNOSIS — R404 Transient alteration of awareness: Secondary | ICD-10-CM | POA: Diagnosis not present

## 2017-02-06 DIAGNOSIS — Z8673 Personal history of transient ischemic attack (TIA), and cerebral infarction without residual deficits: Secondary | ICD-10-CM | POA: Diagnosis not present

## 2017-02-06 DIAGNOSIS — N183 Chronic kidney disease, stage 3 (moderate): Secondary | ICD-10-CM | POA: Diagnosis not present

## 2017-02-06 DIAGNOSIS — I6523 Occlusion and stenosis of bilateral carotid arteries: Secondary | ICD-10-CM | POA: Diagnosis not present

## 2017-02-06 DIAGNOSIS — I639 Cerebral infarction, unspecified: Secondary | ICD-10-CM | POA: Diagnosis not present

## 2017-02-06 DIAGNOSIS — Z880 Allergy status to penicillin: Secondary | ICD-10-CM | POA: Diagnosis not present

## 2017-02-06 DIAGNOSIS — R4182 Altered mental status, unspecified: Secondary | ICD-10-CM | POA: Diagnosis not present

## 2017-02-06 DIAGNOSIS — R05 Cough: Secondary | ICD-10-CM | POA: Diagnosis not present

## 2017-02-06 DIAGNOSIS — E1122 Type 2 diabetes mellitus with diabetic chronic kidney disease: Secondary | ICD-10-CM | POA: Diagnosis not present

## 2017-02-06 DIAGNOSIS — G309 Alzheimer's disease, unspecified: Secondary | ICD-10-CM | POA: Diagnosis not present

## 2017-02-06 DIAGNOSIS — R69 Illness, unspecified: Secondary | ICD-10-CM | POA: Diagnosis not present

## 2017-02-06 DIAGNOSIS — I63239 Cerebral infarction due to unspecified occlusion or stenosis of unspecified carotid arteries: Secondary | ICD-10-CM | POA: Diagnosis not present

## 2017-02-07 DIAGNOSIS — R404 Transient alteration of awareness: Secondary | ICD-10-CM | POA: Diagnosis not present

## 2017-02-07 DIAGNOSIS — I639 Cerebral infarction, unspecified: Secondary | ICD-10-CM | POA: Diagnosis not present

## 2017-02-09 ENCOUNTER — Encounter: Payer: Self-pay | Admitting: Nurse Practitioner

## 2017-02-09 ENCOUNTER — Other Ambulatory Visit: Payer: Self-pay | Admitting: *Deleted

## 2017-02-09 ENCOUNTER — Ambulatory Visit (INDEPENDENT_AMBULATORY_CARE_PROVIDER_SITE_OTHER): Payer: Medicare HMO | Admitting: Nurse Practitioner

## 2017-02-09 VITALS — BP 120/78 | HR 63 | Temp 98.1°F | Resp 16 | Ht 64.0 in | Wt 131.0 lb

## 2017-02-09 DIAGNOSIS — I6930 Unspecified sequelae of cerebral infarction: Secondary | ICD-10-CM

## 2017-02-09 DIAGNOSIS — F0151 Vascular dementia with behavioral disturbance: Secondary | ICD-10-CM | POA: Diagnosis not present

## 2017-02-09 DIAGNOSIS — F482 Pseudobulbar affect: Secondary | ICD-10-CM | POA: Diagnosis not present

## 2017-02-09 DIAGNOSIS — R600 Localized edema: Secondary | ICD-10-CM | POA: Diagnosis not present

## 2017-02-09 DIAGNOSIS — I10 Essential (primary) hypertension: Secondary | ICD-10-CM | POA: Diagnosis not present

## 2017-02-09 DIAGNOSIS — R69 Illness, unspecified: Secondary | ICD-10-CM | POA: Diagnosis not present

## 2017-02-09 DIAGNOSIS — F01518 Vascular dementia, unspecified severity, with other behavioral disturbance: Secondary | ICD-10-CM

## 2017-02-09 MED ORDER — DEXTROMETHORPHAN-QUINIDINE 20-10 MG PO CAPS: ORAL_CAPSULE | ORAL | 0 refills | Status: DC

## 2017-02-09 MED ORDER — EZETIMIBE 10 MG PO TABS: ORAL_TABLET | ORAL | 3 refills | Status: DC

## 2017-02-09 NOTE — Progress Notes (Signed)
Careteam: Patient Care Team: Gayland Curry, DO as PCP - General (Geriatric Medicine)  Advanced Directive information Does Patient Have a Medical Advance Directive?: Yes, Type of Advance Directive: Healthcare Power of Attorney  Allergies  Allergen Reactions  . Penicillins     Chief Complaint  Patient presents with  . Hospitalization Follow-up    Pt was recently admitted to [facility] 6/15/ to 6/17 due to acute ischemic stroke.   . Other    Granddaughter, Great Granddaughter, Sister in room.      HPI: Patient is a 83 y.o. female seen in the office today for hospital follow up. past medical history of hypertension, advanced dementia, history of strokes was presented from home with complaints of altered mental status.  She underwent MRI of the brain which showed new acute ischemic stroke  Carotid Doppler done and shows bilateral 40-59% stenosis in the internal carotid artery and bulky, heterogenous atherosclerosis in the carotid bulb and proximal internal carotid. Given her age and comorbidities, it was not felt like she was a candidate for any intervention. zetia was stopped and she was placed on Lipitor 40 mg daily however she does not tolerate statin (liver stuff per granddaughter). granddaughter  did not make this change and she is cont to give her zetia. Has placed a call to cardiology to see if she can go back on Zetia or if they recommend going to lipitor.  Recommend echo as outpatient.   Pt with dementia. Seroquel was added for supportive treatment due to behaviors. She slept all day yesterday so they did not give it to her last night and today she is having behavior. Also felt like it upset her stomach, if her stomach gets upset she does not eat and then gets dehydrated easily. Okay during visit but earlier today was bad per daughter. Lot of crying. Very emotional. Will have laughing outburst.    Pts blood pressure was persistently elevated during hospitalization  and she was started on amlodipine 5 mg daily  Blood pressure at home 140s-160/70s but she was upset when she took her blood pressure at home.  Having some edema to feet.    Review of Systems:  Review of Systems  Constitutional: Negative for chills, fever, malaise/fatigue and weight loss.  HENT: Negative for congestion.   Eyes: Negative for blurred vision.       Left dry eye  Respiratory: Negative for shortness of breath.   Cardiovascular: Negative for chest pain, palpitations and leg swelling.  Gastrointestinal: Negative for abdominal pain, blood in stool, constipation and melena.  Genitourinary: Negative for dysuria, frequency and urgency.  Musculoskeletal: Negative for falls.  Skin: Negative for itching and rash.  Neurological: Positive for focal weakness. Negative for dizziness, loss of consciousness, weakness and headaches.       Chronic facial droop  Endo/Heme/Allergies: Bruises/bleeds easily.  Psychiatric/Behavioral: Positive for memory loss. Negative for depression.       Behaviors    Past Medical History:  Diagnosis Date  . 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 Confusion    04/19/14:EEG showed abnormal with mild to moderate slow wave over the left frontotemporal area and mild slow wave abnormality in the right temporal region without sz activity   . Diabetes mellitus without complication ([place])   . Essential hypertension, benign 02/20/2016  . Gait disturbance   . Gallbladder disorder   . History of recurrent UTI (urinary tract infection)   . Hyperlipidemia   .  Hypotension   . Intracerebral hemorrhage ([place])   . Memory loss   . Stroke ([place])   . Vascular dementia without behavioral disturbance    Past Surgical History:  Procedure Laterality Date  . CATARACT EXTRACTION  2012   Dr. Bing Plume  . removal partoid tumor  1986  . TONSILLECTOMY     childhood   Social History:   reports that she has never smoked. She has never used smokeless tobacco. She  reports that she does not drink alcohol or use drugs.  Family History  Problem Relation Age of Onset  . Heart disease Mother   . Heart disease Father   . High blood pressure Sister   . High Cholesterol Sister   . Heart disease Sister   . Polymyositis Daughter   . Diabetes Daughter   . Stroke Other        aunt  . Dementia Son        Fontal Temporal Dementia (FTD)   . Thyroid disease Grandchild        Autoimmune thyroid condition     Medications: Patient's Medications  New Prescriptions   No medications on file  Previous Medications   AMLODIPINE (NORVASC) 5 MG TABLET    Take 5 mg by mouth.   ARTIFICIAL TEAR OINTMENT (LUBRICANT EYE) OINT    Apply to eye at bedtime.   BLOOD GLUCOSE MONITORING SUPPL (ONE TOUCH ULTRA 2) W/DEVICE KIT    Check blood sugar 3-4 times daily DX E11.49   CLOPIDOGREL (PLAVIX) 75 MG TABLET    Take 1 tablet (75 mg total) by mouth daily. Patient needs an appointment before anymore refills   DONEPEZIL (ARICEPT) 5 MG TABLET    Take 1 tablet (5 mg total) by mouth at bedtime. Patient needs an appointment before anymore refills   EZETIMIBE (ZETIA) 10 MG TABLET    Take one tablet by mouth once daily for cholesterol   GLUCOSE BLOOD (ONE TOUCH ULTRA TEST) TEST STRIP    Check blood sugar 3-4 times daily DX: K12.24   ONETOUCH DELICA LANCETS 49P MISC    Check blood sugar 3-4 times daily DX E11.49   PANTOPRAZOLE (PROTONIX) 40 MG TABLET    Take one tablet by mouth twice daily with food for stomach. Patient needs appointment before anymore refills.   POLYETHYL GLYCOL-PROPYL GLYCOL 0.4-0.3 % SOLN    Apply to eye.   QUETIAPINE (SEROQUEL) 25 MG TABLET    Take 25 mg by mouth.  Modified Medications   No medications on file  Discontinued Medications   DOXYCYCLINE (VIBRA-TABS) 100 MG TABLET    Take 1 tablet (100 mg total) by mouth 2 (two) times daily.     Physical Exam:  Vitals:   02/09/17 1347  BP: 120/78  Pulse: 63  Resp: 16  Temp: 98.1 F (36.7 C)  TempSrc: Oral    SpO2: 97%  Weight: 131 lb (59.4 kg)  Height: 5' 4"  (1.626 m)   Body mass index is 22.49 kg/m.  Physical Exam  Constitutional: She appears well-developed and well-nourished. No distress.  Cardiovascular: Normal rate, regular rhythm, normal heart sounds and intact distal pulses.   Pulmonary/Chest: Effort normal and breath sounds normal. No respiratory distress.  Abdominal: Soft. Bowel sounds are normal.  Musculoskeletal: She exhibits no edema.  Walks with cane, has chronic droop of left eye Trace edema to bilateral feet  Neurological: She is alert.  Skin: Skin is warm and dry. There is pallor.  Psychiatric: She has a normal mood and  affect.    Labs reviewed: Basic Metabolic Panel:  Recent Labs  01/28/17 0250  NA 137  K 5.6*  CL 107  CO2 26  GLUCOSE 131*  BUN 45*  CREATININE 1.17*  CALCIUM 8.6   Liver Function Tests: No results for input(s): AST, ALT, ALKPHOS, BILITOT, PROT, ALBUMIN in the last 8760 hours. No results for input(s): LIPASE, AMYLASE in the last 8760 hours. No results for input(s): AMMONIA in the last 8760 hours. CBC:  Recent Labs  01/28/17 0250  WBC 8.7  NEUTROABS 4,437  HGB 14.2  HCT 43.4  MCV 90.0  PLT 177   Lipid Panel: No results for input(s): CHOL, HDL, LDLCALC, TRIG, CHOLHDL, LDLDIRECT in the last 8760 hours. TSH: No results for input(s): TSH in the last 8760 hours. A1C: Lab Results  Component Value Date   HGBA1C 6.2 (H) 01/28/2017    Result Date: 02/06/2017 MRI BRAIN CLINICAL DATA: Altered mental status, dementia EXAM: MRI HEAD WITHOUT CONTRAST TECHNIQUE: Multiplanar, multiecho pulse sequences of the brain and surrounding structures were obtained without intravenous contrast. COMPARISON: CT head 01/17/2017 FINDINGS: Brain: Cerebral volume within normal limits for age. Negative for hydrocephalus. 5 mm area of restricted diffusion in the left medial frontal lobe compatible with acute infarct. No other acute infarct identified. Mild  chronic microvascular ischemic changes in the white matter. Small chronic infarct left thalamus. Chronic microhemorrhage in the right frontal lobe and in the right occipital lobe. Negative for mass or fluid collection Vascular: Normal arterial flow void. Skull and upper cervical spine: Negative Sinuses/Orbits: Moderate mucosal edema throughout the paranasal sinuses. No orbital mass Other: None   5 mm acute infarct left medial frontal lobe. Mild chronic microvascular ischemic change Chronic microhemorrhage right frontal lobe and right occipital lobe Mucosal edema in the paranasal sinuses. Electronically Signed By: Franchot Gallo M.D. On: 02/06/2017 10:45     Assessment/Plan 1. Late effect of cerebrovascular accident (CVA) -now with with increase and exaggeration in laughing and crying.  -conts to live with granddaughter and sister.  -referral was made from hospital for home health services -follow up scheduled with neurology -conts on Zetia at this time and planning to ask neurologist abt this vs lipitor -conts on plavix.  - ECHOCARDIOGRAM COMPLETE; Future  2. Essential hypertension -improved on norvasc. Will cont current regimen   3. Vascular dementia with behavior disturbance -worsening behaviors at this time. Started on seroquel 25 mg however made her too lethargic. Will decrease to 12.5 mg PO qhs at this time to see if she is able to tolerate.  -discussed possibility of starting on Namenda. Granddaughter reports increase in GI upset easily which namenda can have benefit with this as well as behaviors.   4. PBA (pseudobulbar affect) Recent frontal lobe CVA now with increase in behaviors, exaggerated emotions in regards to laughing and crying outburst.  -will start neudexta at this time - Dextromethorphan-Quinidine (NUEDEXTA) 20-10 MG CAPS; 1 tablet daily for 1 week then increase to twice daily  Dispense: 60 capsule; Refill: 0  5. Edema suspect this is due to norvasc, to limit sodium,  use compression hose and elevate legs when sitting    Total time 40 mins:  time greater than 50% of total time spent doing review of records, pt counseling and coordination of care regarding recent hospitalization   To keep follow up with Dr Mariea Clonts in 1 month [place] American. Harle Battiest  [facility] [phone] 8 am - 5 pm) [phone] (after hours)

## 2017-02-09 NOTE — Patient Instructions (Signed)
Decrease Seroquel to 12.5 daily  Will start nuedexta for pseudobulbar affect  To start 1 tablet daily for 1 week Then increase to twice daily

## 2017-02-09 NOTE — Telephone Encounter (Signed)
[facility]

## 2017-02-10 ENCOUNTER — Telehealth: Payer: Self-pay | Admitting: *Deleted

## 2017-02-10 DIAGNOSIS — F0150 Vascular dementia without behavioral disturbance: Secondary | ICD-10-CM

## 2017-02-10 MED ORDER — DONEPEZIL HCL 5 MG PO TABS: 5.0000 mg | ORAL_TABLET | Freq: Every day | ORAL | 1 refills | Status: DC

## 2017-02-10 NOTE — Telephone Encounter (Signed)
PA required for Nuedexta. Completed via Cover My Meds.

## 2017-02-10 NOTE — Telephone Encounter (Addendum)
Rx approved and pharmacy notified.

## 2017-02-10 NOTE — Addendum Note (Signed)
Addended by: Royann Shivers A on: 02/10/2017 03:14 PM   Modules accepted: Orders

## 2017-02-13 ENCOUNTER — Other Ambulatory Visit: Payer: Self-pay | Admitting: Internal Medicine

## 2017-02-13 DIAGNOSIS — R69 Illness, unspecified: Secondary | ICD-10-CM | POA: Diagnosis not present

## 2017-02-13 DIAGNOSIS — E1149 Type 2 diabetes mellitus with other diabetic neurological complication: Secondary | ICD-10-CM

## 2017-02-15 ENCOUNTER — Telehealth: Payer: Self-pay | Admitting: *Deleted

## 2017-02-15 NOTE — Telephone Encounter (Signed)
Received PA request for test strips. Will not be covered to test sugar TID-QID. Patient isn't on insulin and DM is stable. Will only be covered for QD testing. Ok to change SIG? Please send back to [facility]. Thanks

## 2017-02-15 NOTE — Telephone Encounter (Signed)
Okay to change to daily

## 2017-02-16 DIAGNOSIS — R69 Illness, unspecified: Secondary | ICD-10-CM | POA: Diagnosis not present

## 2017-02-16 MED ORDER — GLUCOSE BLOOD VI STRP: ORAL_STRIP | 1 refills | Status: DC

## 2017-02-16 MED ORDER — ONETOUCH DELICA LANCETS 33G MISC: 1 refills | Status: DC

## 2017-02-16 NOTE — Telephone Encounter (Signed)
rx changed and sent to the pharmacy [facility]

## 2017-02-17 ENCOUNTER — Telehealth: Payer: Self-pay | Admitting: *Deleted

## 2017-02-17 DIAGNOSIS — R69 Illness, unspecified: Secondary | ICD-10-CM | POA: Diagnosis not present

## 2017-02-17 DIAGNOSIS — R269 Unspecified abnormalities of gait and mobility: Secondary | ICD-10-CM | POA: Diagnosis not present

## 2017-02-17 DIAGNOSIS — I129 Hypertensive chronic kidney disease with stage 1 through stage 4 chronic kidney disease, or unspecified chronic kidney disease: Secondary | ICD-10-CM | POA: Diagnosis not present

## 2017-02-17 DIAGNOSIS — E1122 Type 2 diabetes mellitus with diabetic chronic kidney disease: Secondary | ICD-10-CM | POA: Diagnosis not present

## 2017-02-17 DIAGNOSIS — I69398 Other sequelae of cerebral infarction: Secondary | ICD-10-CM | POA: Diagnosis not present

## 2017-02-17 DIAGNOSIS — N183 Chronic kidney disease, stage 3 (moderate): Secondary | ICD-10-CM | POA: Diagnosis not present

## 2017-02-17 DIAGNOSIS — I69391 Dysphagia following cerebral infarction: Secondary | ICD-10-CM | POA: Diagnosis not present

## 2017-02-17 NOTE — Telephone Encounter (Signed)
Holly with [facility] called and needs verbal orders for OT and Speech evaluation. Verbal orders given.

## 2017-02-18 DIAGNOSIS — I129 Hypertensive chronic kidney disease with stage 1 through stage 4 chronic kidney disease, or unspecified chronic kidney disease: Secondary | ICD-10-CM | POA: Diagnosis not present

## 2017-02-18 DIAGNOSIS — R269 Unspecified abnormalities of gait and mobility: Secondary | ICD-10-CM | POA: Diagnosis not present

## 2017-02-18 DIAGNOSIS — I69398 Other sequelae of cerebral infarction: Secondary | ICD-10-CM | POA: Diagnosis not present

## 2017-02-18 DIAGNOSIS — I69391 Dysphagia following cerebral infarction: Secondary | ICD-10-CM | POA: Diagnosis not present

## 2017-02-18 DIAGNOSIS — E1122 Type 2 diabetes mellitus with diabetic chronic kidney disease: Secondary | ICD-10-CM | POA: Diagnosis not present

## 2017-02-18 DIAGNOSIS — N183 Chronic kidney disease, stage 3 (moderate): Secondary | ICD-10-CM | POA: Diagnosis not present

## 2017-02-18 DIAGNOSIS — R69 Illness, unspecified: Secondary | ICD-10-CM | POA: Diagnosis not present

## 2017-02-19 ENCOUNTER — Telehealth: Payer: Self-pay | Admitting: *Deleted

## 2017-02-19 DIAGNOSIS — I69398 Other sequelae of cerebral infarction: Secondary | ICD-10-CM | POA: Diagnosis not present

## 2017-02-19 DIAGNOSIS — R69 Illness, unspecified: Secondary | ICD-10-CM | POA: Diagnosis not present

## 2017-02-19 DIAGNOSIS — E1122 Type 2 diabetes mellitus with diabetic chronic kidney disease: Secondary | ICD-10-CM | POA: Diagnosis not present

## 2017-02-19 DIAGNOSIS — I69391 Dysphagia following cerebral infarction: Secondary | ICD-10-CM | POA: Diagnosis not present

## 2017-02-19 DIAGNOSIS — I129 Hypertensive chronic kidney disease with stage 1 through stage 4 chronic kidney disease, or unspecified chronic kidney disease: Secondary | ICD-10-CM | POA: Diagnosis not present

## 2017-02-19 DIAGNOSIS — N183 Chronic kidney disease, stage 3 (moderate): Secondary | ICD-10-CM | POA: Diagnosis not present

## 2017-02-19 DIAGNOSIS — R269 Unspecified abnormalities of gait and mobility: Secondary | ICD-10-CM | POA: Diagnosis not present

## 2017-02-19 NOTE — Telephone Encounter (Signed)
[name] with [facility] called requesting verbal orders for PT 2x4wks. Orders given.

## 2017-02-23 ENCOUNTER — Telehealth: Payer: Self-pay

## 2017-02-23 DIAGNOSIS — I129 Hypertensive chronic kidney disease with stage 1 through stage 4 chronic kidney disease, or unspecified chronic kidney disease: Secondary | ICD-10-CM | POA: Diagnosis not present

## 2017-02-23 DIAGNOSIS — I69391 Dysphagia following cerebral infarction: Secondary | ICD-10-CM | POA: Diagnosis not present

## 2017-02-23 DIAGNOSIS — N183 Chronic kidney disease, stage 3 (moderate): Secondary | ICD-10-CM | POA: Diagnosis not present

## 2017-02-23 DIAGNOSIS — R69 Illness, unspecified: Secondary | ICD-10-CM | POA: Diagnosis not present

## 2017-02-23 DIAGNOSIS — I69398 Other sequelae of cerebral infarction: Secondary | ICD-10-CM | POA: Diagnosis not present

## 2017-02-23 DIAGNOSIS — R269 Unspecified abnormalities of gait and mobility: Secondary | ICD-10-CM | POA: Diagnosis not present

## 2017-02-23 DIAGNOSIS — E1122 Type 2 diabetes mellitus with diabetic chronic kidney disease: Secondary | ICD-10-CM | POA: Diagnosis not present

## 2017-02-23 NOTE — Telephone Encounter (Signed)
Almyra Free with [facility] called to request verbal orders to treat patient 1 x weekly for 6 weeks.  Per [employer] standing order, verbal order given. Message will be sent to patient's provider as a FYI.

## 2017-02-25 DIAGNOSIS — I69398 Other sequelae of cerebral infarction: Secondary | ICD-10-CM | POA: Diagnosis not present

## 2017-02-25 DIAGNOSIS — R69 Illness, unspecified: Secondary | ICD-10-CM | POA: Diagnosis not present

## 2017-02-25 DIAGNOSIS — I129 Hypertensive chronic kidney disease with stage 1 through stage 4 chronic kidney disease, or unspecified chronic kidney disease: Secondary | ICD-10-CM | POA: Diagnosis not present

## 2017-02-25 DIAGNOSIS — N183 Chronic kidney disease, stage 3 (moderate): Secondary | ICD-10-CM | POA: Diagnosis not present

## 2017-02-25 DIAGNOSIS — E1122 Type 2 diabetes mellitus with diabetic chronic kidney disease: Secondary | ICD-10-CM | POA: Diagnosis not present

## 2017-02-25 DIAGNOSIS — I69391 Dysphagia following cerebral infarction: Secondary | ICD-10-CM | POA: Diagnosis not present

## 2017-02-25 DIAGNOSIS — R269 Unspecified abnormalities of gait and mobility: Secondary | ICD-10-CM | POA: Diagnosis not present

## 2017-02-26 DIAGNOSIS — I69398 Other sequelae of cerebral infarction: Secondary | ICD-10-CM | POA: Diagnosis not present

## 2017-02-26 DIAGNOSIS — R69 Illness, unspecified: Secondary | ICD-10-CM | POA: Diagnosis not present

## 2017-02-26 DIAGNOSIS — I69391 Dysphagia following cerebral infarction: Secondary | ICD-10-CM | POA: Diagnosis not present

## 2017-02-26 DIAGNOSIS — E1122 Type 2 diabetes mellitus with diabetic chronic kidney disease: Secondary | ICD-10-CM | POA: Diagnosis not present

## 2017-02-26 DIAGNOSIS — N183 Chronic kidney disease, stage 3 (moderate): Secondary | ICD-10-CM | POA: Diagnosis not present

## 2017-02-26 DIAGNOSIS — R269 Unspecified abnormalities of gait and mobility: Secondary | ICD-10-CM | POA: Diagnosis not present

## 2017-02-26 DIAGNOSIS — I129 Hypertensive chronic kidney disease with stage 1 through stage 4 chronic kidney disease, or unspecified chronic kidney disease: Secondary | ICD-10-CM | POA: Diagnosis not present

## 2017-03-01 ENCOUNTER — Telehealth: Payer: Self-pay

## 2017-03-01 DIAGNOSIS — I129 Hypertensive chronic kidney disease with stage 1 through stage 4 chronic kidney disease, or unspecified chronic kidney disease: Secondary | ICD-10-CM | POA: Diagnosis not present

## 2017-03-01 DIAGNOSIS — I69398 Other sequelae of cerebral infarction: Secondary | ICD-10-CM | POA: Diagnosis not present

## 2017-03-01 DIAGNOSIS — E1122 Type 2 diabetes mellitus with diabetic chronic kidney disease: Secondary | ICD-10-CM | POA: Diagnosis not present

## 2017-03-01 DIAGNOSIS — R269 Unspecified abnormalities of gait and mobility: Secondary | ICD-10-CM | POA: Diagnosis not present

## 2017-03-01 DIAGNOSIS — I69391 Dysphagia following cerebral infarction: Secondary | ICD-10-CM | POA: Diagnosis not present

## 2017-03-01 DIAGNOSIS — R69 Illness, unspecified: Secondary | ICD-10-CM | POA: Diagnosis not present

## 2017-03-01 DIAGNOSIS — N183 Chronic kidney disease, stage 3 (moderate): Secondary | ICD-10-CM | POA: Diagnosis not present

## 2017-03-01 NOTE — Telephone Encounter (Signed)
Erin Hubbard the granddaughter called [phone], she had to cancel her appt 03/11/17 with Dr. Mariea Clonts, because her grandfather has appt same day at [facility] and can't cancel it. She would like Dr. Mariea Clonts work in her grandmother sooner than the November appt. Can't go to neurologist, he is no longer there, office broke up. She is still crying and having hallucinations. Told her I would sent message to Dr. Mariea Clonts

## 2017-03-01 NOTE — Telephone Encounter (Signed)
Perhaps we can call them if an appt opens up sooner?  Her dementia is progressing and apparently the antipsychotic has not helped with the hallucinations.

## 2017-03-01 NOTE — Telephone Encounter (Signed)
Rosann Auerbach (Warden/ranger) with [facility] called to request verbal orders for 1 x weekly for 2 weeks.  Per [employer] standing order, verbal order given. Message will be sent to patient's provider as a FYI.

## 2017-03-02 ENCOUNTER — Other Ambulatory Visit: Payer: Self-pay

## 2017-03-02 ENCOUNTER — Ambulatory Visit (HOSPITAL_COMMUNITY): Payer: Medicare HMO | Attending: Internal Medicine

## 2017-03-02 DIAGNOSIS — I10 Essential (primary) hypertension: Secondary | ICD-10-CM | POA: Diagnosis not present

## 2017-03-02 DIAGNOSIS — E785 Hyperlipidemia, unspecified: Secondary | ICD-10-CM | POA: Diagnosis not present

## 2017-03-02 DIAGNOSIS — I6930 Unspecified sequelae of cerebral infarction: Secondary | ICD-10-CM | POA: Diagnosis not present

## 2017-03-02 DIAGNOSIS — R69 Illness, unspecified: Secondary | ICD-10-CM | POA: Diagnosis not present

## 2017-03-02 DIAGNOSIS — N183 Chronic kidney disease, stage 3 (moderate): Secondary | ICD-10-CM | POA: Diagnosis not present

## 2017-03-02 DIAGNOSIS — I69398 Other sequelae of cerebral infarction: Secondary | ICD-10-CM | POA: Diagnosis not present

## 2017-03-02 DIAGNOSIS — E119 Type 2 diabetes mellitus without complications: Secondary | ICD-10-CM | POA: Insufficient documentation

## 2017-03-02 DIAGNOSIS — I69391 Dysphagia following cerebral infarction: Secondary | ICD-10-CM | POA: Diagnosis not present

## 2017-03-02 DIAGNOSIS — R269 Unspecified abnormalities of gait and mobility: Secondary | ICD-10-CM | POA: Diagnosis not present

## 2017-03-02 DIAGNOSIS — E1122 Type 2 diabetes mellitus with diabetic chronic kidney disease: Secondary | ICD-10-CM | POA: Diagnosis not present

## 2017-03-02 DIAGNOSIS — I129 Hypertensive chronic kidney disease with stage 1 through stage 4 chronic kidney disease, or unspecified chronic kidney disease: Secondary | ICD-10-CM | POA: Diagnosis not present

## 2017-03-03 DIAGNOSIS — R269 Unspecified abnormalities of gait and mobility: Secondary | ICD-10-CM | POA: Diagnosis not present

## 2017-03-03 DIAGNOSIS — N183 Chronic kidney disease, stage 3 (moderate): Secondary | ICD-10-CM | POA: Diagnosis not present

## 2017-03-03 DIAGNOSIS — R69 Illness, unspecified: Secondary | ICD-10-CM | POA: Diagnosis not present

## 2017-03-03 DIAGNOSIS — I69398 Other sequelae of cerebral infarction: Secondary | ICD-10-CM | POA: Diagnosis not present

## 2017-03-03 DIAGNOSIS — I129 Hypertensive chronic kidney disease with stage 1 through stage 4 chronic kidney disease, or unspecified chronic kidney disease: Secondary | ICD-10-CM | POA: Diagnosis not present

## 2017-03-03 DIAGNOSIS — I69391 Dysphagia following cerebral infarction: Secondary | ICD-10-CM | POA: Diagnosis not present

## 2017-03-03 DIAGNOSIS — E1122 Type 2 diabetes mellitus with diabetic chronic kidney disease: Secondary | ICD-10-CM | POA: Diagnosis not present

## 2017-03-04 ENCOUNTER — Encounter: Payer: Self-pay | Admitting: *Deleted

## 2017-03-04 ENCOUNTER — Encounter: Payer: Self-pay | Admitting: Internal Medicine

## 2017-03-04 ENCOUNTER — Ambulatory Visit (INDEPENDENT_AMBULATORY_CARE_PROVIDER_SITE_OTHER): Payer: Medicare HMO | Admitting: Internal Medicine

## 2017-03-04 VITALS — BP 128/60 | HR 60 | Temp 98.5°F | Wt 132.0 lb

## 2017-03-04 DIAGNOSIS — I129 Hypertensive chronic kidney disease with stage 1 through stage 4 chronic kidney disease, or unspecified chronic kidney disease: Secondary | ICD-10-CM | POA: Diagnosis not present

## 2017-03-04 DIAGNOSIS — R269 Unspecified abnormalities of gait and mobility: Secondary | ICD-10-CM | POA: Diagnosis not present

## 2017-03-04 DIAGNOSIS — F0150 Vascular dementia without behavioral disturbance: Secondary | ICD-10-CM

## 2017-03-04 DIAGNOSIS — I6930 Unspecified sequelae of cerebral infarction: Secondary | ICD-10-CM

## 2017-03-04 DIAGNOSIS — F419 Anxiety disorder, unspecified: Secondary | ICD-10-CM | POA: Diagnosis not present

## 2017-03-04 DIAGNOSIS — R69 Illness, unspecified: Secondary | ICD-10-CM | POA: Diagnosis not present

## 2017-03-04 DIAGNOSIS — R440 Auditory hallucinations: Secondary | ICD-10-CM | POA: Diagnosis not present

## 2017-03-04 DIAGNOSIS — R51 Headache: Secondary | ICD-10-CM

## 2017-03-04 DIAGNOSIS — I69398 Other sequelae of cerebral infarction: Secondary | ICD-10-CM | POA: Diagnosis not present

## 2017-03-04 DIAGNOSIS — I69391 Dysphagia following cerebral infarction: Secondary | ICD-10-CM | POA: Diagnosis not present

## 2017-03-04 DIAGNOSIS — N183 Chronic kidney disease, stage 3 (moderate): Secondary | ICD-10-CM | POA: Diagnosis not present

## 2017-03-04 DIAGNOSIS — R519 Headache, unspecified: Secondary | ICD-10-CM

## 2017-03-04 DIAGNOSIS — E1122 Type 2 diabetes mellitus with diabetic chronic kidney disease: Secondary | ICD-10-CM | POA: Diagnosis not present

## 2017-03-04 MED ORDER — ACETAMINOPHEN 500 MG PO TABS: 500.0000 mg | ORAL_TABLET | Freq: Every day | ORAL | 0 refills | Status: DC

## 2017-03-04 MED ORDER — LORAZEPAM 0.5 MG PO TABS: 0.2500 mg | ORAL_TABLET | Freq: Four times a day (QID) | ORAL | 0 refills | Status: DC | PRN

## 2017-03-04 NOTE — Progress Notes (Signed)
Location:  [facility] clinic Provider: Lutisha Knoche L. Mariea Clonts, D.O., C.M.D.  Code Status: DNR Goals of Care:  Advanced Directives 02/09/2017  Does Patient Have a Medical Advance Directive? Yes  Type of Advance Directive [place]  Does patient want to make changes to medical advance directive? -  Copy of [place] in Chart? Yes  Would patient like information on creating a medical advance directive? -  Pre-existing out of facility DNR order (yellow form or pink MOST form) -   Chief Complaint  Patient presents with  . Acute Visit    discuss seroquel    HPI: Patient is a 83 y.o. female seen today for an acute visit for continued hallucinations that were upsetting to patient. She was placed on seroquel which I was well aware was on [employer] list, but psychosis was becoming a problem for the patient, so she was treated.  She has had a recent stroke in context of HTN.  She's been delusional about the circumstances of her husband's death.  She has known dementia and prior ischemic stroke and facial nerve damage from a surgical removal of a benign tumor.    MRI on 6/16 had shown a microhemorrhage of the right frontal lobe and right occipital lobe plus sinus congestion.  There was some question if she had amyloid angiopathy at one point.    Hallucinations are still happening despite seroquel.  Having crying episodes for a few hours to half of the day.  They seem to come from the hallucinations and she cannot be redirected.  She's super tired so going to bed b/w 9-10pm, gets up at 7am, has been napping some in the mornings and that seems to help a little with less hallucinations and crying spells.  She will stomp her feet and get back.  Pharmacist did not like seroquel in her age group.  Clearly family does not want her on the seroquel due to this, so will try ativan. They feel they need a neurologist now.      Endurance is less and granddaughter feels her endurance is down.   HR 66 after walking around and in 50s when lying down.  Only went to 70 with some exertion. Aortic regurg is trial.  Reviewed today.  They report she has a "skip in the heart" that she used to take medication for.  EKG was sinus brady in 2008.    Nuedexta is too expensive to try--also does not seem like true pseudobulbar affect.    Pt does hold her head a lot though she always denies pain.  Unclear if it's pain or hallucinations.  Working with speech therapy for mild dysphagia.  Cognitively unchanged for the most part with ST with home health.   She is needing more assistance with bathing and dressing at home.  PT is unclear if she needs a walker.  She is using a cane now.    Past Medical History:  Diagnosis Date  . 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 Confusion    04/19/14:EEG showed abnormal with mild to moderate slow wave over the left frontotemporal area and mild slow wave abnormality in the right temporal region without sz activity   . Diabetes mellitus without complication ([place])   . Essential hypertension, benign 02/20/2016  . Gait disturbance   . Gallbladder disorder   . History of recurrent UTI (urinary tract infection)   . Hyperlipidemia   . Hypotension   . Intracerebral  hemorrhage ([place])   . Memory loss   . Stroke ([place])   . Vascular dementia without behavioral disturbance     Past Surgical History:  Procedure Laterality Date  . CATARACT EXTRACTION  2012   Dr. Bing Plume  . removal partoid tumor  1986  . TONSILLECTOMY     childhood    Allergies  Allergen Reactions  . Penicillins     Allergies as of 03/04/2017      Reactions   Penicillins       Medication List       Accurate as of 03/04/17 10:49 AM. Always use your most recent med list.          amLODipine 5 MG tablet Commonly known as:  NORVASC Take 5 mg by mouth.   clopidogrel 75 MG tablet Commonly known as:  PLAVIX Take 1 tablet (75 mg total) by mouth daily. Patient needs an appointment  before anymore refills   donepezil 5 MG tablet Commonly known as:  ARICEPT Take 1 tablet (5 mg total) by mouth at bedtime.   ezetimibe 10 MG tablet Commonly known as:  ZETIA Take one tablet by mouth once daily for cholesterol   glucose blood test strip Commonly known as:  ONE TOUCH ULTRA TEST Use as instructed to test blood sugar once daily DX: E11.49   LUBRICANT EYE Oint Apply to eye at bedtime.   ONETOUCH DELICA LANCETS 76A Misc Use as instructed to test blood sugar once daily DX: E11.49   pantoprazole 40 MG tablet Commonly known as:  PROTONIX Take one tablet by mouth twice daily with food for stomach. Patient needs appointment before anymore refills.   Polyethyl Glycol-Propyl Glycol 0.4-0.3 % Soln Apply to eye.   QUEtiapine 25 MG tablet Commonly known as:  SEROQUEL Take 25 mg by mouth.       Review of Systems:  Review of Systems  Constitutional: Negative for chills and fever.  HENT: Negative for congestion and hearing loss.   Eyes: Negative for blurred vision.  Respiratory: Negative for shortness of breath.   Cardiovascular: Negative for chest pain, palpitations and leg swelling.  Gastrointestinal: Negative for abdominal pain, blood in stool, constipation, diarrhea and melena.  Genitourinary: Negative for dysuria.  Musculoskeletal: Negative for falls.       Ambulates with cane  Skin: Negative for itching and rash.  Neurological: Negative for dizziness and loss of consciousness.       Holds head (not clear if headache for sure)  Endo/Heme/Allergies: Bruises/bleeds easily.  Psychiatric/Behavioral: Positive for hallucinations and memory loss. Negative for depression, substance abuse and suicidal ideas. The patient is nervous/anxious. The patient does not have insomnia.     Health Maintenance  Topic Date Due  . DEXA SCAN  07/05/1992  . URINE MICROALBUMIN  11/30/2015  . FOOT EXAM  02/19/2017  . OPHTHALMOLOGY EXAM  08/20/2017 (Originally 07/05/1935)  .  TETANUS/TDAP  08/24/2017 (Originally 07/05/1944)  . INFLUENZA VACCINE  03/24/2017  . HEMOGLOBIN A1C  07/30/2017  . PNA vac Low Risk Adult  Completed    Physical Exam: Vitals:   03/04/17 1041  BP: 128/60  Pulse: 60  Temp: 98.5 F (36.9 C)  TempSrc: Oral  SpO2: 97%  Weight: 132 lb (59.9 kg)   Body mass index is 22.66 kg/m. Physical Exam  Constitutional: She appears well-developed. No distress.  HENT:  Left facial droop chronic  Neck: Neck supple. No JVD present.  Cardiovascular: Normal rate, regular rhythm, normal heart sounds and intact distal pulses.  Pulmonary/Chest: Effort normal and breath sounds normal. No respiratory distress.  Abdominal: Bowel sounds are normal.  Musculoskeletal: Normal range of motion.  Lymphadenopathy:    She has no cervical adenopathy.  Neurological: She is alert.  Skin: Skin is warm and dry. Capillary refill takes less than 2 seconds.  Psychiatric: She has a normal mood and affect.    Labs reviewed: Basic Metabolic Panel:  Recent Labs  01/28/17 0250  NA 137  K 5.6*  CL 107  CO2 26  GLUCOSE 131*  BUN 45*  CREATININE 1.17*  CALCIUM 8.6   Liver Function Tests: No results for input(s): AST, ALT, ALKPHOS, BILITOT, PROT, ALBUMIN in the last 8760 hours. No results for input(s): LIPASE, AMYLASE in the last 8760 hours. No results for input(s): AMMONIA in the last 8760 hours. CBC:  Recent Labs  01/28/17 0250  WBC 8.7  NEUTROABS 4,437  HGB 14.2  HCT 43.4  MCV 90.0  PLT 177   Lipid Panel: No results for input(s): CHOL, HDL, LDLCALC, TRIG, CHOLHDL, LDLDIRECT in the last 8760 hours. Lab Results  Component Value Date   HGBA1C 6.2 (H) 01/28/2017    Procedures since last visit: Echo reviewed from 03/02/17:  ER 17-49%, grade 1 diastolic dysfunction, aortic valve sclerosis   with trivial AI, trivial MR, TR, RVSP 21 mmHg, normal IVC.  Assessment/Plan 1. Vascular dementia without behavioral disturbance - she is now having psychosis  and we tried her on seroquel b/c she is overtly psychotic and was getting very agitated about her hallucinations; however, pt's granddaughter consulted with a pharmacist who reminded her that Seroquel is on the [employer] list (along with anything else we might use for this by the way) - they are agreeable to stopping the seroquel which is making her sleepy, may interact with amlodipine - Ambulatory referral to Neurology at family request for this same problem -we opted to stop zetia at this time for her cholesterol due to age and prognosis  2. Late effect of cerebrovascular accident (CVA) - Ambulatory referral to Neurology - there was some question in the past of cerebral amyloidosis -cont PT, OT, ST at home and increased help with ADLs in am  3. Nonintractable headache, unspecified chronicity pattern, unspecified headache type - not entirely clear that she truly has headache, but sits holding her head (could be from hallucinations) so will try putting her on a small dose of tylenol and see if it helps her periods of anxiety/agitation and helps keep her from holding her head - acetaminophen (TYLENOL) 500 MG tablet; Take 1 tablet (500 mg total) by mouth daily. For possible pain  Dispense: 30 tablet; Refill: 0  4. Anxiety - seems related to hallucinations - discussed that ativan also is on beer's list and can be sedating, but can be used as needed at its lowest dose to see if it helps her - LORazepam (ATIVAN) 0.5 MG tablet; Take 0.5 tablets (0.25 mg total) by mouth every 6 (six) hours as needed for anxiety (or agitation).  Dispense: 60 tablet; Refill: 0  5. Auditory hallucinations - new in past couple of mos and may or may not be due to new tiny bleeds in frontal and occipital lobes - Ambulatory referral to Neurology  Labs/tests ordered:   Orders Placed This Encounter  Procedures  . Ambulatory referral to Neurology    Referral Priority:   Routine    Referral Type:   Consultation    Referral  Reason:   Specialty Services Required  Requested Specialty:   Neurology    Number of Visits Requested:   1   Next appt:  07/08/2017 for med mgt  Leasa Kincannon L. Aaliyah Gavel, D.O. [facility] [address], [place] [address] Cell Phone (Mon-Fri 8am-5pm):  [phone] On Call:  [phone] & follow prompts after 5pm & weekends Office Phone:  [phone] Office Fax:  [phone]

## 2017-03-05 ENCOUNTER — Encounter: Payer: Self-pay | Admitting: Neurology

## 2017-03-09 ENCOUNTER — Telehealth: Payer: Self-pay | Admitting: *Deleted

## 2017-03-09 DIAGNOSIS — N183 Chronic kidney disease, stage 3 (moderate): Secondary | ICD-10-CM | POA: Diagnosis not present

## 2017-03-09 DIAGNOSIS — R269 Unspecified abnormalities of gait and mobility: Secondary | ICD-10-CM | POA: Diagnosis not present

## 2017-03-09 DIAGNOSIS — R69 Illness, unspecified: Secondary | ICD-10-CM | POA: Diagnosis not present

## 2017-03-09 DIAGNOSIS — I69398 Other sequelae of cerebral infarction: Secondary | ICD-10-CM | POA: Diagnosis not present

## 2017-03-09 DIAGNOSIS — I69391 Dysphagia following cerebral infarction: Secondary | ICD-10-CM | POA: Diagnosis not present

## 2017-03-09 DIAGNOSIS — I129 Hypertensive chronic kidney disease with stage 1 through stage 4 chronic kidney disease, or unspecified chronic kidney disease: Secondary | ICD-10-CM | POA: Diagnosis not present

## 2017-03-09 DIAGNOSIS — E1122 Type 2 diabetes mellitus with diabetic chronic kidney disease: Secondary | ICD-10-CM | POA: Diagnosis not present

## 2017-03-09 MED ORDER — QUETIAPINE FUMARATE 25 MG PO TABS: ORAL_TABLET | ORAL | 0 refills | Status: DC

## 2017-03-09 NOTE — Telephone Encounter (Signed)
Ok to give just 7 25mg  seroquel to be cut in 1/2, then d/c seroquel.  She's on such a low dose that taper should not need to be prolonged.

## 2017-03-09 NOTE — Telephone Encounter (Signed)
Daughter notified and agreed. Rx faxed to pharmacy for #7

## 2017-03-09 NOTE — Telephone Encounter (Signed)
[name], daughter called and stated that they would like a refill on the Seroquel, Just a half supply, because they are trying to wean patient off of it per pharmacist. Has not started the Ativan yet. This week she has cut the pills(25mg ) in half and been giving her a half tablet daily. Please Advise.

## 2017-03-10 DIAGNOSIS — I129 Hypertensive chronic kidney disease with stage 1 through stage 4 chronic kidney disease, or unspecified chronic kidney disease: Secondary | ICD-10-CM | POA: Diagnosis not present

## 2017-03-10 DIAGNOSIS — I69398 Other sequelae of cerebral infarction: Secondary | ICD-10-CM | POA: Diagnosis not present

## 2017-03-10 DIAGNOSIS — E1122 Type 2 diabetes mellitus with diabetic chronic kidney disease: Secondary | ICD-10-CM | POA: Diagnosis not present

## 2017-03-10 DIAGNOSIS — R69 Illness, unspecified: Secondary | ICD-10-CM | POA: Diagnosis not present

## 2017-03-10 DIAGNOSIS — N183 Chronic kidney disease, stage 3 (moderate): Secondary | ICD-10-CM | POA: Diagnosis not present

## 2017-03-10 DIAGNOSIS — R269 Unspecified abnormalities of gait and mobility: Secondary | ICD-10-CM | POA: Diagnosis not present

## 2017-03-10 DIAGNOSIS — I69391 Dysphagia following cerebral infarction: Secondary | ICD-10-CM | POA: Diagnosis not present

## 2017-03-11 ENCOUNTER — Ambulatory Visit: Payer: Medicare HMO | Admitting: Internal Medicine

## 2017-03-11 DIAGNOSIS — I69398 Other sequelae of cerebral infarction: Secondary | ICD-10-CM | POA: Diagnosis not present

## 2017-03-11 DIAGNOSIS — E1122 Type 2 diabetes mellitus with diabetic chronic kidney disease: Secondary | ICD-10-CM | POA: Diagnosis not present

## 2017-03-11 DIAGNOSIS — R69 Illness, unspecified: Secondary | ICD-10-CM | POA: Diagnosis not present

## 2017-03-11 DIAGNOSIS — I69391 Dysphagia following cerebral infarction: Secondary | ICD-10-CM | POA: Diagnosis not present

## 2017-03-11 DIAGNOSIS — R269 Unspecified abnormalities of gait and mobility: Secondary | ICD-10-CM | POA: Diagnosis not present

## 2017-03-11 DIAGNOSIS — I129 Hypertensive chronic kidney disease with stage 1 through stage 4 chronic kidney disease, or unspecified chronic kidney disease: Secondary | ICD-10-CM | POA: Diagnosis not present

## 2017-03-11 DIAGNOSIS — N183 Chronic kidney disease, stage 3 (moderate): Secondary | ICD-10-CM | POA: Diagnosis not present

## 2017-03-12 ENCOUNTER — Other Ambulatory Visit: Payer: Self-pay | Admitting: *Deleted

## 2017-03-12 MED ORDER — AMLODIPINE BESYLATE 5 MG PO TABS: 5.0000 mg | ORAL_TABLET | Freq: Every day | ORAL | 3 refills | Status: DC

## 2017-03-12 NOTE — Telephone Encounter (Signed)
[facility]

## 2017-03-14 DIAGNOSIS — R69 Illness, unspecified: Secondary | ICD-10-CM | POA: Diagnosis not present

## 2017-03-16 ENCOUNTER — Telehealth: Payer: Self-pay | Admitting: *Deleted

## 2017-03-16 DIAGNOSIS — E1122 Type 2 diabetes mellitus with diabetic chronic kidney disease: Secondary | ICD-10-CM | POA: Diagnosis not present

## 2017-03-16 DIAGNOSIS — I129 Hypertensive chronic kidney disease with stage 1 through stage 4 chronic kidney disease, or unspecified chronic kidney disease: Secondary | ICD-10-CM | POA: Diagnosis not present

## 2017-03-16 DIAGNOSIS — N183 Chronic kidney disease, stage 3 (moderate): Secondary | ICD-10-CM | POA: Diagnosis not present

## 2017-03-16 DIAGNOSIS — R69 Illness, unspecified: Secondary | ICD-10-CM | POA: Diagnosis not present

## 2017-03-16 DIAGNOSIS — R269 Unspecified abnormalities of gait and mobility: Secondary | ICD-10-CM | POA: Diagnosis not present

## 2017-03-16 DIAGNOSIS — I69398 Other sequelae of cerebral infarction: Secondary | ICD-10-CM | POA: Diagnosis not present

## 2017-03-16 DIAGNOSIS — I69391 Dysphagia following cerebral infarction: Secondary | ICD-10-CM | POA: Diagnosis not present

## 2017-03-16 NOTE — Telephone Encounter (Signed)
Erin Hubbard with [facility] called today and stated that patient has had a change of condition. Stated that patient became SOB with very little activity and exertion. Pulse was 69 after walking and the Normal is between 40's-50's. Pulse dropped to 62 after resting. Patient's family is concerned and wonders if she needs an appointment with you or a referral to a Cardiologist. Please Advise.   Erin Hubbard daughter) [phone]

## 2017-03-16 NOTE — Telephone Encounter (Signed)
I called and spoke with [facility] and she stated that all other vital were Normal and not concerning. Stated that her pulse was since she is normally in the 40's-50's. Also stated that she was concerned with her symptoms of SOB with low activity. She stated that patient has had a condition change since last week, stated patient was able to do more activity with No SOB. Please Advise.

## 2017-03-16 NOTE — Telephone Encounter (Signed)
This heart rate is not very concerning to me, but I do wonder about the other vitals?  Were the BP, RR and O2 sat normal?  It's hard to evaluate patients with one vital sign only.  She could be seen in our office for an EKG first before going to a cardiologist.

## 2017-03-16 NOTE — Telephone Encounter (Signed)
She needs to be seen.

## 2017-03-17 ENCOUNTER — Ambulatory Visit (INDEPENDENT_AMBULATORY_CARE_PROVIDER_SITE_OTHER): Payer: Medicare HMO | Admitting: Internal Medicine

## 2017-03-17 ENCOUNTER — Ambulatory Visit
Admission: RE | Admit: 2017-03-17 | Discharge: 2017-03-17 | Disposition: A | Discharge status: Home / Self Care | Payer: Medicare HMO | Source: Ambulatory Visit | Attending: Internal Medicine | Admitting: Internal Medicine

## 2017-03-17 ENCOUNTER — Encounter: Payer: Self-pay | Admitting: Internal Medicine

## 2017-03-17 VITALS — BP 138/68 | HR 63 | Temp 98.0°F | Ht 64.0 in | Wt 127.2 lb

## 2017-03-17 DIAGNOSIS — F01518 Vascular dementia, unspecified severity, with other behavioral disturbance: Secondary | ICD-10-CM

## 2017-03-17 DIAGNOSIS — F0151 Vascular dementia with behavioral disturbance: Secondary | ICD-10-CM | POA: Diagnosis not present

## 2017-03-17 DIAGNOSIS — R5383 Other fatigue: Secondary | ICD-10-CM | POA: Diagnosis not present

## 2017-03-17 DIAGNOSIS — R69 Illness, unspecified: Secondary | ICD-10-CM | POA: Diagnosis not present

## 2017-03-17 DIAGNOSIS — R251 Tremor, unspecified: Secondary | ICD-10-CM | POA: Diagnosis not present

## 2017-03-17 DIAGNOSIS — R0602 Shortness of breath: Secondary | ICD-10-CM | POA: Diagnosis not present

## 2017-03-17 LAB — CBC WITH DIFFERENTIAL/PLATELET
BASOS PCT: 0 %
Basophils Absolute: 0 cells/uL (ref 0–200)
Eosinophils Absolute: 282 cells/uL (ref 15–500)
Eosinophils Relative: 3 %
HCT: 43.8 % (ref 35.0–45.0)
HEMOGLOBIN: 14.5 g/dL (ref 11.7–15.5)
LYMPHS ABS: 2068 {cells}/uL (ref 850–3900)
Lymphocytes Relative: 22 %
MCH: 29.6 pg (ref 27.0–33.0)
MCHC: 33.1 g/dL (ref 32.0–36.0)
MCV: 89.4 fL (ref 80.0–100.0)
MONO ABS: 1316 {cells}/uL — AB (ref 200–950)
MPV: 10.7 fL (ref 7.5–12.5)
Monocytes Relative: 14 %
NEUTROS ABS: 5734 {cells}/uL (ref 1500–7800)
Neutrophils Relative %: 61 %
Platelets: 193 10*3/uL (ref 140–400)
RBC: 4.9 MIL/uL (ref 3.80–5.10)
RDW: 13 % (ref 11.0–15.0)
WBC: 9.4 10*3/uL (ref 3.8–10.8)

## 2017-03-17 LAB — COMPLETE METABOLIC PANEL WITH GFR
ALT: 13 U/L (ref 6–29)
AST: 16 U/L (ref 10–35)
Albumin: 3.6 g/dL (ref 3.6–5.1)
Alkaline Phosphatase: 80 U/L (ref 33–130)
BUN: 38 mg/dL — ABNORMAL HIGH (ref 7–25)
CALCIUM: 8.6 mg/dL (ref 8.6–10.4)
CHLORIDE: 108 mmol/L (ref 98–110)
CO2: 22 mmol/L (ref 20–31)
Creat: 1.24 mg/dL — ABNORMAL HIGH (ref 0.60–0.88)
GFR, EST AFRICAN AMERICAN: 45 mL/min — AB (ref >=60)
GFR, Est Non African American: 39 mL/min — ABNORMAL LOW (ref >=60)
Glucose, Bld: 127 mg/dL — ABNORMAL HIGH (ref 65–99)
POTASSIUM: 4.9 mmol/L (ref 3.5–5.3)
Sodium: 138 mmol/L (ref 135–146)
Total Bilirubin: 0.5 mg/dL (ref 0.2–1.2)
Total Protein: 6.6 g/dL (ref 6.1–8.1)

## 2017-03-17 NOTE — Telephone Encounter (Signed)
Called and spoke with [place] daughter, Joellen [place]. Appointment scheduled for today with Dr. Eulas Post.

## 2017-03-17 NOTE — Progress Notes (Signed)
Patient ID: Erin Hubbard, female   DOB: Aug 29, 1924, 83 y.o.   MRN: [id number]    Location:  [facility] Place of Service: OFFICE  Chief Complaint  Patient presents with  . Acute Visit    patient here with granddaughter and she states that patient could not complete PT due to SOB, handshaking, and fatigue, and pulse was a little abnormal.    HPI:  83 yo female pt of Dr Cyndi Lennert seen today for SOB. Per phone note 03/16/17, while performing PT at home, pulse increased to 69 (usually 40-50s) and she became SOB with min exertion + tremors and fatigue. Pulse did drop to 62 with rest. Family requested her to be seen as this is a change in condition for her. No thoracic pain, CP, f/c, N/V, abdominal pain. Family reports she was holding left lower back 2 days ago. She has R>L leg swelling that is worse. She takes amlodipine for HTN. It was started prior to d/c from hospital in June 2018. Her 2D echo revealed EF 65-70% with grde 1 DD; aortic valve sclerosis but no stenosis; trivial AI.  She was seen by Dr Mariea Clonts on 03/04/17 for hallucinations. seroquel stopped due to family request and she was placed on ativan. She has progressive vascular dementia   She is a poor historian due to dementia. Hx obtained from chart  Past Medical History:  Diagnosis Date  . 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 Confusion    04/19/14:EEG showed abnormal with mild to moderate slow wave over the left frontotemporal area and mild slow wave abnormality in the right temporal region without sz activity   . Diabetes mellitus without complication ([place])   . Essential hypertension, benign 02/20/2016  . Gait disturbance   . Gallbladder disorder   . History of recurrent UTI (urinary tract infection)   . Hyperlipidemia   . Hypotension   . Intracerebral hemorrhage ([place])   . Memory loss   . Stroke ([place])   . Vascular dementia without behavioral disturbance     Past Surgical History:  Procedure Laterality Date  . CATARACT  EXTRACTION  2012   Dr. Bing Plume  . removal partoid tumor  1986  . TONSILLECTOMY     childhood    Patient Care Team: Gayland Curry, DO as PCP - General (Geriatric Medicine)  Social History   Social History  . Marital status: Widowed    Spouse name: N/A  . Number of children: N/A  . Years of education: N/A   Occupational History  . Not on file.   Social History Main Topics  . Smoking status: Never Smoker  . Smokeless tobacco: Never Used  . Alcohol use No  . Drug use: No  . Sexual activity: No   Other Topics Concern  . Not on file   Social History Narrative   Pt. Lives in a one story house, with four people, one large dog   Current/pass profession- paralegal   Pt. Does not exercise   Lives in house with her granddaughter who is her 24 hr caregiver   4 people total live in her home   Has a large dog   Has hcpoa, but no living will or dnr form at this time when establishing     reports that she has never smoked. She has never used smokeless tobacco. She reports that she does not drink alcohol or use drugs.  Family History  Problem Relation Age of Onset  . Heart disease  Mother   . Heart disease Father   . High blood pressure Sister   . High Cholesterol Sister   . Heart disease Sister   . Polymyositis Daughter   . Diabetes Daughter   . Stroke Other        aunt  . Dementia Son        Fontal Temporal Dementia (FTD)   . Thyroid disease Grandchild        Autoimmune thyroid condition    Family Status  Relation Status  . Mother Deceased  . Father Deceased  . Sister Alive  . Daughter Alive  . Daughter Deceased  . Sister (Not Specified)  . Sister (Not Specified)  . Sister (Not Specified)  . Daughter (Not Specified)  . Other (Not Specified)  . Son Alive  . GChild Alive     Allergies  Allergen Reactions  . Penicillins     Medications: Patient's Medications  New Prescriptions   No medications on file  Previous Medications   ACETAMINOPHEN (TYLENOL)  500 MG TABLET    Take 1 tablet (500 mg total) by mouth daily. For possible pain   AMLODIPINE (NORVASC) 5 MG TABLET    Take 1 tablet (5 mg total) by mouth daily.   ARTIFICIAL TEAR OINTMENT (LUBRICANT EYE) OINT    Apply to eye at bedtime.   CLOPIDOGREL (PLAVIX) 75 MG TABLET    Take 1 tablet (75 mg total) by mouth daily. Patient needs an appointment before anymore refills   DONEPEZIL (ARICEPT) 5 MG TABLET    Take 1 tablet (5 mg total) by mouth at bedtime.   GLUCOSE BLOOD (ONE TOUCH ULTRA TEST) TEST STRIP    Use as instructed to test blood sugar once daily DX: E11.49   LORAZEPAM (ATIVAN) 0.5 MG TABLET    Take 0.5 tablets (0.25 mg total) by mouth every 6 (six) hours as needed for anxiety (or agitation).   ONETOUCH DELICA LANCETS 81W MISC    Use as instructed to test blood sugar once daily DX: E11.49   PANTOPRAZOLE (PROTONIX) 40 MG TABLET    Take one tablet by mouth twice daily with food for stomach. Patient needs appointment before anymore refills.   POLYETHYL GLYCOL-PROPYL GLYCOL 0.4-0.3 % SOLN    Apply to eye.   QUETIAPINE (SEROQUEL) 25 MG TABLET    Take 1/2 tablet by mouth once daily then discontinue  Modified Medications   No medications on file  Discontinued Medications   No medications on file    Review of Systems  Unable to perform ROS: Dementia    Vitals:   03/17/17 1118  BP: 138/68  Pulse: 63  Temp: 98 F (36.7 C)  TempSrc: Oral  SpO2: 96%  Weight: 127 lb 3.2 oz (57.7 kg)  Height: _0  (1.626 m)   Body mass index is 21.83 kg/m.  Physical Exam  Constitutional: She appears well-developed and well-nourished.    HENT:  Mouth/Throat: Oropharynx is clear and moist. No oropharyngeal exudate.  MMM; no oral thrush  Eyes: Pupils are equal, round, and reactive to light. No scleral icterus.  Neck: Neck supple. Carotid bruit is not present. No tracheal deviation present.  Cardiovascular: Normal rate, regular rhythm and intact distal pulses.   Occasional extrasystoles are present.  Exam reveals no gallop and no friction rub.   Murmur (1/6 DM) heard. No LE edema b/l. No calf TTP; no palpable cords   Pulmonary/Chest: Effort normal and breath sounds normal. No stridor. No respiratory distress. She has no wheezes.  She has no rales.  Abdominal: Soft. Normal appearance and bowel sounds are normal. She exhibits no distension and no mass. There is no hepatomegaly. There is no tenderness. There is no rigidity, no rebound and no guarding. No hernia.  Musculoskeletal: She exhibits edema and deformity.  No short leg; L>R SI joint restriction; L>R ASIS TTP; right knee swelling medially with crepitus/grinding on ROM; right ankle swelling; reduced right hip ROM  Lymphadenopathy:    She has no cervical adenopathy.  Neurological: She is alert. A cranial nerve deficit (left facial droop) is present.  Skin: Skin is warm and dry. No rash noted.  Psychiatric: She has a normal mood and affect. Her behavior is normal. Her speech is slurred.     Labs reviewed: Office Visit on 01/28/2017  Component Date Value Ref Range Status  . WBC 01/28/2017 8.7  3.8 - 10.8 K/uL Final  . RBC 01/28/2017 4.82  3.80 - 5.10 MIL/uL Final  . Hemoglobin 01/28/2017 14.2  11.7 - 15.5 g/dL Final  . HCT 01/28/2017 43.4  35.0 - 45.0 % Final  . MCV 01/28/2017 90.0  80.0 - 100.0 fL Final  . MCH 01/28/2017 29.5  27.0 - 33.0 pg Final  . MCHC 01/28/2017 32.7  32.0 - 36.0 g/dL Final  . RDW 01/28/2017 13.0  11.0 - 15.0 % Final  . Platelets 01/28/2017 177  140 - 400 K/uL Final  . MPV 01/28/2017 10.4  7.5 - 12.5 fL Final  . Neutro Abs 01/28/2017 4437  1,500 - 7,800 cells/uL Final  . Lymphs Abs 01/28/2017 2610  850 - 3,900 cells/uL Final  . Monocytes Absolute 01/28/2017 1131* 200 - 950 cells/uL Final  . Eosinophils Absolute 01/28/2017 435  15 - 500 cells/uL Final  . Basophils Absolute 01/28/2017 87  0 - 200 cells/uL Final  . Neutrophils Relative % 01/28/2017 51  % Final  . Lymphocytes Relative 01/28/2017 30  % Final  .  Monocytes Relative 01/28/2017 13  % Final  . Eosinophils Relative 01/28/2017 5  % Final  . Basophils Relative 01/28/2017 1  % Final  . Smear Review 01/28/2017 Criteria for review not met   Final  . Sodium 01/28/2017 137  135 - 146 mmol/L Final  . Potassium 01/28/2017 5.6* 3.5 - 5.3 mmol/L Final   No visible hemolysis.  . Chloride 01/28/2017 107  98 - 110 mmol/L Final  . CO2 01/28/2017 26  20 - 31 mmol/L Final  . Glucose, Bld 01/28/2017 131* 65 - 99 mg/dL Final  . BUN 01/28/2017 45* 7 - 25 mg/dL Final  . Creat 01/28/2017 1.17* 0.60 - 0.88 mg/dL Final   Comment:   For patients > or = 81 years of age: The upper reference limit for Creatinine is approximately 13% higher for people identified as African-American.     . Calcium 01/28/2017 8.6  8.6 - 10.4 mg/dL Final  . Hgb A1c MFr Bld 01/28/2017 6.2* <5.7 % Final   Comment:   For someone without known diabetes, a hemoglobin A1c value between 5.7% and 6.4% is consistent with prediabetes and should be confirmed with a follow-up test.   For someone with known diabetes, a value <7% indicates that their diabetes is well controlled. A1c targets should be individualized based on duration of diabetes, age, co-morbid conditions and other considerations.   This assay result is consistent with an increased risk of diabetes.   Currently, no consensus exists regarding use of hemoglobin A1c for diagnosis of diabetes in children.     Marland Kitchen  Mean Plasma Glucose 01/28/2017 131  mg/dL Final    No results found. ECG OBTAINED AND REVIEWED BY MYSELF: 1st degree AVB @ 54 bpm, nml axis, LAE, poor R wave progression, slight IVCD, no acute ischemic changes. No change since 01/2007.   Assessment/Plan   ICD-10-CM   1. SOB (shortness of breath) on exertion R06.02 DG Chest 2 View    CBC with Differential/Platelets    EKG 12-Lead  2. Tremors of nervous system R25.1 Magnesium  3. Vascular dementia with behavior disturbance F01.51   4. Fatigue, unspecified type  R53.83 CMP with eGFR    Magnesium   Continue current meds as ordered  Will call with xray and lab results  Follow up with Dr Mariea Clonts as scheduled or sooner if you have problems/concerns  Camdynn Maranto S. Perlie Gold  [facility] [address] [place], [place] [address] [phone] Cell (Monday-Friday 8 AM - 5 PM) [phone] After 5 PM and follow prompts

## 2017-03-17 NOTE — Patient Instructions (Addendum)
Continue current meds as ordered  Will call with xray and lab results  Follow up with Dr Mariea Clonts as scheduled or sooner if you have problems/concerns

## 2017-03-18 ENCOUNTER — Other Ambulatory Visit: Payer: Self-pay

## 2017-03-18 ENCOUNTER — Telehealth: Payer: Self-pay | Admitting: *Deleted

## 2017-03-18 DIAGNOSIS — R0600 Dyspnea, unspecified: Secondary | ICD-10-CM

## 2017-03-18 DIAGNOSIS — I129 Hypertensive chronic kidney disease with stage 1 through stage 4 chronic kidney disease, or unspecified chronic kidney disease: Secondary | ICD-10-CM | POA: Diagnosis not present

## 2017-03-18 DIAGNOSIS — R269 Unspecified abnormalities of gait and mobility: Secondary | ICD-10-CM | POA: Diagnosis not present

## 2017-03-18 DIAGNOSIS — R69 Illness, unspecified: Secondary | ICD-10-CM | POA: Diagnosis not present

## 2017-03-18 DIAGNOSIS — E1122 Type 2 diabetes mellitus with diabetic chronic kidney disease: Secondary | ICD-10-CM | POA: Diagnosis not present

## 2017-03-18 DIAGNOSIS — I69391 Dysphagia following cerebral infarction: Secondary | ICD-10-CM | POA: Diagnosis not present

## 2017-03-18 DIAGNOSIS — I69398 Other sequelae of cerebral infarction: Secondary | ICD-10-CM | POA: Diagnosis not present

## 2017-03-18 DIAGNOSIS — R0609 Other forms of dyspnea: Principal | ICD-10-CM

## 2017-03-18 DIAGNOSIS — N183 Chronic kidney disease, stage 3 (moderate): Secondary | ICD-10-CM | POA: Diagnosis not present

## 2017-03-18 LAB — MAGNESIUM: MAGNESIUM: 2.1 mg/dL (ref 1.5–2.5)

## 2017-03-18 NOTE — Telephone Encounter (Signed)
Physical therapist calling asking for verbal order to continue physical therapy for 2 weeks. Verbal order given, also they wanted a consult with cardiology, I advised pt could call and make appt since she has established with cardiology.

## 2017-03-19 DIAGNOSIS — E1122 Type 2 diabetes mellitus with diabetic chronic kidney disease: Secondary | ICD-10-CM | POA: Diagnosis not present

## 2017-03-19 DIAGNOSIS — I69398 Other sequelae of cerebral infarction: Secondary | ICD-10-CM | POA: Diagnosis not present

## 2017-03-19 DIAGNOSIS — R269 Unspecified abnormalities of gait and mobility: Secondary | ICD-10-CM | POA: Diagnosis not present

## 2017-03-19 DIAGNOSIS — N183 Chronic kidney disease, stage 3 (moderate): Secondary | ICD-10-CM | POA: Diagnosis not present

## 2017-03-19 DIAGNOSIS — I129 Hypertensive chronic kidney disease with stage 1 through stage 4 chronic kidney disease, or unspecified chronic kidney disease: Secondary | ICD-10-CM | POA: Diagnosis not present

## 2017-03-19 DIAGNOSIS — R69 Illness, unspecified: Secondary | ICD-10-CM | POA: Diagnosis not present

## 2017-03-19 DIAGNOSIS — I69391 Dysphagia following cerebral infarction: Secondary | ICD-10-CM | POA: Diagnosis not present

## 2017-03-23 ENCOUNTER — Other Ambulatory Visit: Payer: Self-pay | Admitting: Internal Medicine

## 2017-03-24 DIAGNOSIS — N183 Chronic kidney disease, stage 3 (moderate): Secondary | ICD-10-CM | POA: Diagnosis not present

## 2017-03-24 DIAGNOSIS — I129 Hypertensive chronic kidney disease with stage 1 through stage 4 chronic kidney disease, or unspecified chronic kidney disease: Secondary | ICD-10-CM | POA: Diagnosis not present

## 2017-03-24 DIAGNOSIS — R69 Illness, unspecified: Secondary | ICD-10-CM | POA: Diagnosis not present

## 2017-03-24 DIAGNOSIS — I69398 Other sequelae of cerebral infarction: Secondary | ICD-10-CM | POA: Diagnosis not present

## 2017-03-24 DIAGNOSIS — E1122 Type 2 diabetes mellitus with diabetic chronic kidney disease: Secondary | ICD-10-CM | POA: Diagnosis not present

## 2017-03-24 DIAGNOSIS — R269 Unspecified abnormalities of gait and mobility: Secondary | ICD-10-CM | POA: Diagnosis not present

## 2017-03-24 DIAGNOSIS — I69391 Dysphagia following cerebral infarction: Secondary | ICD-10-CM | POA: Diagnosis not present

## 2017-03-29 ENCOUNTER — Telehealth: Payer: Self-pay | Admitting: *Deleted

## 2017-03-29 DIAGNOSIS — K219 Gastro-esophageal reflux disease without esophagitis: Secondary | ICD-10-CM | POA: Diagnosis not present

## 2017-03-29 DIAGNOSIS — Z880 Allergy status to penicillin: Secondary | ICD-10-CM | POA: Diagnosis not present

## 2017-03-29 DIAGNOSIS — R9431 Abnormal electrocardiogram [ECG] [EKG]: Secondary | ICD-10-CM | POA: Diagnosis not present

## 2017-03-29 DIAGNOSIS — R29810 Facial weakness: Secondary | ICD-10-CM | POA: Diagnosis not present

## 2017-03-29 DIAGNOSIS — Z8673 Personal history of transient ischemic attack (TIA), and cerebral infarction without residual deficits: Secondary | ICD-10-CM | POA: Diagnosis not present

## 2017-03-29 DIAGNOSIS — Z79899 Other long term (current) drug therapy: Secondary | ICD-10-CM | POA: Diagnosis not present

## 2017-03-29 DIAGNOSIS — R440 Auditory hallucinations: Secondary | ICD-10-CM | POA: Diagnosis not present

## 2017-03-29 DIAGNOSIS — I69398 Other sequelae of cerebral infarction: Secondary | ICD-10-CM | POA: Diagnosis not present

## 2017-03-29 DIAGNOSIS — Z7902 Long term (current) use of antithrombotics/antiplatelets: Secondary | ICD-10-CM | POA: Diagnosis not present

## 2017-03-29 DIAGNOSIS — I440 Atrioventricular block, first degree: Secondary | ICD-10-CM | POA: Diagnosis not present

## 2017-03-29 DIAGNOSIS — R4182 Altered mental status, unspecified: Secondary | ICD-10-CM | POA: Diagnosis not present

## 2017-03-29 DIAGNOSIS — R69 Illness, unspecified: Secondary | ICD-10-CM | POA: Diagnosis not present

## 2017-03-29 DIAGNOSIS — F0151 Vascular dementia with behavioral disturbance: Secondary | ICD-10-CM | POA: Diagnosis not present

## 2017-03-29 NOTE — Telephone Encounter (Signed)
Almyra Free with [facility] called requesting verbal for recert ST [id number]. Verbal order given.

## 2017-03-30 DIAGNOSIS — Z8673 Personal history of transient ischemic attack (TIA), and cerebral infarction without residual deficits: Secondary | ICD-10-CM | POA: Diagnosis not present

## 2017-03-30 DIAGNOSIS — F0151 Vascular dementia with behavioral disturbance: Secondary | ICD-10-CM | POA: Diagnosis not present

## 2017-03-31 ENCOUNTER — Ambulatory Visit: Payer: Medicare HMO | Admitting: Cardiology

## 2017-04-01 DIAGNOSIS — R9431 Abnormal electrocardiogram [ECG] [EKG]: Secondary | ICD-10-CM | POA: Diagnosis not present

## 2017-04-01 DIAGNOSIS — I69398 Other sequelae of cerebral infarction: Secondary | ICD-10-CM | POA: Diagnosis not present

## 2017-04-01 DIAGNOSIS — R29810 Facial weakness: Secondary | ICD-10-CM | POA: Diagnosis not present

## 2017-04-01 DIAGNOSIS — I6782 Cerebral ischemia: Secondary | ICD-10-CM | POA: Diagnosis not present

## 2017-04-01 DIAGNOSIS — I499 Cardiac arrhythmia, unspecified: Secondary | ICD-10-CM | POA: Diagnosis not present

## 2017-04-01 DIAGNOSIS — I440 Atrioventricular block, first degree: Secondary | ICD-10-CM | POA: Diagnosis not present

## 2017-04-01 DIAGNOSIS — Z79899 Other long term (current) drug therapy: Secondary | ICD-10-CM | POA: Diagnosis not present

## 2017-04-01 DIAGNOSIS — I69311 Memory deficit following cerebral infarction: Secondary | ICD-10-CM | POA: Diagnosis not present

## 2017-04-01 DIAGNOSIS — K219 Gastro-esophageal reflux disease without esophagitis: Secondary | ICD-10-CM | POA: Diagnosis not present

## 2017-04-01 DIAGNOSIS — Z7902 Long term (current) use of antithrombotics/antiplatelets: Secondary | ICD-10-CM | POA: Diagnosis not present

## 2017-04-01 DIAGNOSIS — S50812S Abrasion of left forearm, sequela: Secondary | ICD-10-CM | POA: Diagnosis not present

## 2017-04-01 DIAGNOSIS — R69 Illness, unspecified: Secondary | ICD-10-CM | POA: Diagnosis not present

## 2017-04-01 DIAGNOSIS — F0151 Vascular dementia with behavioral disturbance: Secondary | ICD-10-CM | POA: Diagnosis not present

## 2017-04-01 DIAGNOSIS — Z880 Allergy status to penicillin: Secondary | ICD-10-CM | POA: Diagnosis not present

## 2017-04-02 DIAGNOSIS — R69 Illness, unspecified: Secondary | ICD-10-CM | POA: Diagnosis not present

## 2017-04-06 ENCOUNTER — Telehealth: Payer: Self-pay

## 2017-04-06 NOTE — Telephone Encounter (Signed)
[name] called to inform Dr.Reed that her grandmother was involuntary committed to [facility] in [place], [place]. Patient with mood swings, very combative, out of character and hallucinations.  Patient to be discharged tomorrow 04/07/17. Patient's granddaughter does not think she needs to be discharged. [name] states she is going to need some help and the doctor at [facility] was very rude, doctor stated he is not going to medicate patient as a benefit to the family, neither would he order home health for medication management.  On another line [facility] had called (spoke with Terry/CMA), they were calling to schedule a follow-up appointment (already scheduled by granddaughter). Terry requested records to be sent and was told they would be.    Scheduled appointment for Thursday, please advise if you have any recommendations prior.

## 2017-04-07 ENCOUNTER — Telehealth: Payer: Self-pay

## 2017-04-07 DIAGNOSIS — Z79899 Other long term (current) drug therapy: Secondary | ICD-10-CM | POA: Diagnosis not present

## 2017-04-07 DIAGNOSIS — R69 Illness, unspecified: Secondary | ICD-10-CM | POA: Diagnosis not present

## 2017-04-07 NOTE — Telephone Encounter (Signed)
If home health is needed, we can certainly order that for medication management.  I'm not sure what else I can do here.  I will review the records when we receive them.

## 2017-04-07 NOTE — Telephone Encounter (Signed)
I spoke with Conchita Paris, Discharge Coordinator for [facility] in [place] on 04/06/17 when Mrs Sabra Heck called to schedule a follow up appointment after patient's discharge from their facility. An appointment was being scheduled by Granddaughter at same time as my conversation with Mrs. Sabra Heck. While on the phone, I asked mrs. Miller to fax over the patient's records and discharge summary for review before patient's appointment on 04/08/17 and she stated that they would be faxed.   Records have not been received yet, so I left a voicemail asking that records be faxed today.

## 2017-04-08 ENCOUNTER — Ambulatory Visit (INDEPENDENT_AMBULATORY_CARE_PROVIDER_SITE_OTHER): Payer: Medicare HMO | Admitting: Internal Medicine

## 2017-04-08 ENCOUNTER — Telehealth: Payer: Self-pay | Admitting: *Deleted

## 2017-04-08 ENCOUNTER — Encounter: Payer: Self-pay | Admitting: Internal Medicine

## 2017-04-08 VITALS — BP 130/70 | HR 72 | Wt 138.0 lb

## 2017-04-08 DIAGNOSIS — F01518 Vascular dementia, unspecified severity, with other behavioral disturbance: Secondary | ICD-10-CM

## 2017-04-08 DIAGNOSIS — R69 Illness, unspecified: Secondary | ICD-10-CM | POA: Diagnosis not present

## 2017-04-08 DIAGNOSIS — E119 Type 2 diabetes mellitus without complications: Secondary | ICD-10-CM | POA: Diagnosis not present

## 2017-04-08 DIAGNOSIS — S90812A Abrasion, left foot, initial encounter: Secondary | ICD-10-CM

## 2017-04-08 DIAGNOSIS — F0151 Vascular dementia with behavioral disturbance: Secondary | ICD-10-CM

## 2017-04-08 DIAGNOSIS — F322 Major depressive disorder, single episode, severe without psychotic features: Secondary | ICD-10-CM | POA: Diagnosis not present

## 2017-04-08 MED ORDER — CITALOPRAM HYDROBROMIDE 10 MG PO TABS: 10.0000 mg | ORAL_TABLET | Freq: Every day | ORAL | 3 refills | Status: DC

## 2017-04-08 MED ORDER — BUSPIRONE HCL 5 MG PO TABS: 5.0000 mg | ORAL_TABLET | Freq: Every day | ORAL | 0 refills | Status: DC | PRN
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
## 2017-04-08 NOTE — Telephone Encounter (Signed)
Left message for [facility] that pt is now wanting to restart home health. Pt was hospitalized and home health was paused.

## 2017-04-08 NOTE — Patient Instructions (Signed)
Please apply a cushioned dressing over the sore on the top of her left foot, change every other day and as needed.

## 2017-04-12 NOTE — Telephone Encounter (Signed)
..  left message to have [facility] return my call.

## 2017-04-13 ENCOUNTER — Ambulatory Visit (INDEPENDENT_AMBULATORY_CARE_PROVIDER_SITE_OTHER): Payer: Medicare HMO | Admitting: Neurology

## 2017-04-13 ENCOUNTER — Encounter: Payer: Self-pay | Admitting: Neurology

## 2017-04-13 VITALS — BP 134/50 | HR 64 | Ht 64.0 in | Wt 134.0 lb

## 2017-04-13 DIAGNOSIS — F01518 Vascular dementia, unspecified severity, with other behavioral disturbance: Secondary | ICD-10-CM

## 2017-04-13 DIAGNOSIS — F0151 Vascular dementia with behavioral disturbance: Secondary | ICD-10-CM

## 2017-04-13 DIAGNOSIS — R69 Illness, unspecified: Secondary | ICD-10-CM | POA: Diagnosis not present

## 2017-04-13 NOTE — Patient Instructions (Signed)
1. Continue all your medications 2. Continue to monitor mood 3. Follow-up in 6 months, call for any changes

## 2017-04-13 NOTE — Progress Notes (Signed)
NEUROLOGY CONSULTATION NOTE  Erin Hubbard MRN: [id number] DOB: Jul 11, 1925  Referring provider: Dr. Hollace Kinnier Primary care provider: Dr. Hollace Kinnier  Reason for consult:  dementia  Dear Dr Mariea Clonts:  Thank you for your kind referral of Erin Hubbard for consultation of the above symptoms. Although her history is well known to you, please allow me to reiterate it for the purpose of our medical record. The patient was accompanied to the clinic by her granddaughter who also provides collateral information. Records and images were personally reviewed where available.  HISTORY OF PRESENT ILLNESS: This is a pleasant 83 year old right-handed woman with a history of hypertension, hyperlipidemia, diabetes, right temporal intracranial bleed in 2013, left parotid tumor resection, vascular dementia, presenting for neurological care. She had been going to [facility] neurology for many years but the practice has closed down, they are planning to return to her prior neurologist's care once office opens up again, but would like to have neurological care in the meantime. Records were reviewed. She was diagnosed with dementia around 2014. In June 2018, she started having spells of increased emotionality. She cried while doing PT for no clear reason, it appears she was thinking she did wrong with her husband's burial 13 years prior. Her son was diagnosed with frontotemporal dementia at age 5, and this had been difficult for her. At that time, she denied any anxiety or depression. A few days later, she was brought to [facility] due to altered mental status. She was hallucinating, stating people told her things that were not true, being hysterical. She had an MRI brain 02/06/17 which showed a 67mm acute infarct in the left medial frontal lobe, mild chronic microvascular disease, chronic microhemorrhage in right frontal lobe and right occipital lobe. EEG was reported as normal. Her granddaughter reports that  since the stroke, something has been off. She would have crying spells that became progressively worse, there were days she would cry almost all day. She became more agitated and tried to hit family when they touched her. She would get upset about going to appointments. She was having hallucinations, trying to get up and leave, at one point threw the dog, her cane, at family. She would cry, then laugh, then get quiet. She was not sleeping well. Her granddaughter reports she was having delusions of the devils taking her husband away and making her disappear. Her family brought her to the ER on 03/29/17 for aggressive behavior, paranoia, and psychotic features in a sundowning pattern. She had side effects on Seroquel (drugged, did not help with hallucinations) and was started on Depakote. She was admitted at [facility] until 04/06/17, then transferred to [facility] in [place]. Granddaughter reports Depakote was stopped in [place], she is unsure if there were side effects. She had Ativan which made her more hyper. She had seen her PCP last 04/08/16 and was started on citalopram and prn buspirone. She has been doing better over the past week, she is calm in the office today. She has been staying with her sister. Her granddaughter states there has been a big improvement, she takes her medications when administered to her. Appetite is good. One time she thought she had already eaten. She fell last Sunday when she got off balance and fell backwards, no injuries. She has occasional dizziness upon standing, she has occasional numbness in the last 3 fingers of her left hand. She has occasional swallowing difficulties and has been having home speech therapy per granddaughter. She denies any headaches,  diplopia, neck/back pain, bowel/bladder dysfunction. Her family wonders if she sleeps well because she takes naps in the daytime, she does not wander at night. She can dress and bathe herself but family assists to ensure safety.  Her granddaughter reports tremors since all these symptoms started, which is bothersome for the patient. She apparently was doing well with PT, until she started having some shortness of breath last July.   PAST MEDICAL HISTORY: Past Medical History:  Diagnosis Date  . 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 Confusion    04/19/14:EEG showed abnormal with mild to moderate slow wave over the left frontotemporal area and mild slow wave abnormality in the right temporal region without sz activity   . Diabetes mellitus without complication ([place])   . Essential hypertension, benign 02/20/2016  . Gait disturbance   . Gallbladder disorder   . History of recurrent UTI (urinary tract infection)   . Hyperlipidemia   . Hypotension   . Intracerebral hemorrhage ([place])   . Memory loss   . Stroke ([place])   . Vascular dementia without behavioral disturbance     PAST SURGICAL HISTORY: Past Surgical History:  Procedure Laterality Date  . CATARACT EXTRACTION  2012   Dr. Bing Plume  . removal partoid tumor  1986  . TONSILLECTOMY     childhood    MEDICATIONS: Current Outpatient Prescriptions on File Prior to Visit  Medication Sig Dispense Refill  . acetaminophen (TYLENOL) 500 MG tablet Take 1 tablet (500 mg total) by mouth daily. For possible pain 30 tablet 0  . amLODipine (NORVASC) 5 MG tablet Take 1 tablet (5 mg total) by mouth daily. 30 tablet 3  . Artificial Tear Ointment (LUBRICANT EYE) OINT Apply to eye at bedtime.    . busPIRone (BUSPAR) 5 MG tablet Take 1 tablet (5 mg total) by mouth daily as needed (anxiety). 15 tablet 0  . citalopram (CELEXA) 10 MG tablet Take 1 tablet (10 mg total) by mouth daily. 30 tablet 3  . clopidogrel (PLAVIX) 75 MG tablet Take 1 tablet (75 mg total) by mouth daily. Patient needs an appointment before anymore refills 90 tablet 0  . donepezil (ARICEPT) 5 MG tablet Take 1 tablet (5 mg total) by mouth at bedtime. 90 tablet 1  . glucose blood (ONE TOUCH ULTRA  TEST) test strip Use as instructed to test blood sugar once daily DX: E11.49 100 each 1  . ONETOUCH DELICA LANCETS 32K MISC Use as instructed to test blood sugar once daily DX: E11.49 100 each 1  . pantoprazole (PROTONIX) 40 MG tablet TAKE 1 TABLET BY MOUTH 2 TIMES A DAY WITH FOOD FOR STOMACH 180 tablet 1  . Polyethyl Glycol-Propyl Glycol 0.4-0.3 % SOLN Apply to eye.     No current facility-administered medications on file prior to visit.     ALLERGIES: Allergies  Allergen Reactions  . Penicillins     FAMILY HISTORY: Family History  Problem Relation Age of Onset  . Heart disease Mother   . Heart disease Father   . High blood pressure Sister   . High Cholesterol Sister   . Heart disease Sister   . Polymyositis Daughter   . Diabetes Daughter   . Stroke Other        aunt  . Dementia Son        Fontal Temporal Dementia (FTD)   . Thyroid disease Grandchild        Autoimmune thyroid condition     SOCIAL HISTORY:  Social History   Social History  . Marital status: Widowed    Spouse name: N/A  . Number of children: N/A  . Years of education: N/A   Occupational History  . Not on file.   Social History Main Topics  . Smoking status: Never Smoker  . Smokeless tobacco: Never Used  . Alcohol use No  . Drug use: No  . Sexual activity: No   Other Topics Concern  . Not on file   Social History Narrative   Pt. Lives in a one story house, with four people, one large dog   Current/pass profession- paralegal   Pt. Does not exercise   Lives in house with her granddaughter who is her 24 hr caregiver   4 people total live in her home   Has a large dog   Has hcpoa, but no living will or dnr form at this time when establishing    REVIEW OF SYSTEMS: Constitutional: No fevers, chills, or sweats, no generalized fatigue, change in appetite Eyes: No visual changes, double vision, eye pain Ear, nose and throat: No hearing loss, ear pain, nasal congestion, sore  throat Cardiovascular: No chest pain, palpitations Respiratory:  No shortness of breath at rest or with exertion, wheezes GastrointestinaI: No nausea, vomiting, diarrhea, abdominal pain, fecal incontinence Genitourinary:  No dysuria, urinary retention or frequency Musculoskeletal:  No neck pain, back pain Integumentary: No rash, pruritus, skin lesions Neurological: as above Psychiatric: No depression, insomnia, anxiety Endocrine: No palpitations, fatigue, diaphoresis, mood swings, change in appetite, change in weight, increased thirst Hematologic/Lymphatic:  No anemia, purpura, petechiae. Allergic/Immunologic: no itchy/runny eyes, nasal congestion, recent allergic reactions, rashes  PHYSICAL EXAM: Vitals:   04/13/17 1013  BP: (!) 134/50  Pulse: 64  SpO2: 97%   General: No acute distress Head:  Normocephalic, chronic left peripheral facial weakness s/p parotid surgery Eyes: Fundoscopic exam shows bilateral sharp discs, no vessel changes, exudates, or hemorrhages Neck: supple, no paraspinal tenderness, full range of motion Back: No paraspinal tenderness Heart: regular rate and rhythm Lungs: Clear to auscultation bilaterally. Vascular: No carotid bruits. Skin/Extremities: No rash, no edema Neurological Exam: Mental status: alert and oriented to person, states she is in [facility], oriented to month/year. No dysarthria or aphasia, Fund of knowledge is appropriate.  Recent and remote memory are reduced.  Attention and concentration are normal.    Able to name objects and repeat phrases. CDT 4/5 MMSE - Mini Mental State Exam 04/13/2017 06/25/2016 11/30/2014  Orientation to time 2 3 2   Orientation to Place 3 3 5   Registration 3 3 3   Attention/ Calculation 4 5 5   Recall 0 3 2  Language- name 2 objects 2 2 2   Language- repeat 1 1 1   Language- follow 3 step command 3 3 3   Language- read & follow direction 1 1 1   Write a sentence 1 1 1   Copy design 1 1 1   Total score 21 26 26    Cranial  nerves: CN I: not tested CN II: pupils equal, round and reactive to light, visual fields intact, fundi unremarkable. CN III, IV, VI:  full range of motion, no nystagmus, no ptosis CN V: facial sensation intact CN VII: left peripheral facial palsy (chronic from parotid surgery) with some synkinesis (occasional twitching) CN VIII: hearing intact to finger rub CN IX, X: gag intact, uvula midline CN XI: sternocleidomastoid and trapezius muscles intact CN XII: tongue midline Bulk & Tone: normal, no cogwheeling, no fasciculations. Motor: 5/5 throughout with no pronator  drift. Sensation: intact to light touch, cold, pin, vibration and joint position sense.  No extinction to double simultaneous stimulation.  Romberg test negative Deep Tendon Reflexes: +1 throughout, no ankle clonus Plantar responses: downgoing bilaterally Cerebellar: no incoordination on finger to nose, heel to shin. No dysdiadochokinesia Gait: slow and cautious, no ataxia Tremor: none in office today  IMPRESSION: This is an 83 year old right-handed woman with a history of hypertension, hyperlipidemia, diabetes, right temporal intracranial bleed in 2013, left parotid tumor resection, vascular dementia, presenting for neurological care. She had an acute change in personality/behavior since June, found to have a small left frontal stroke, followed by to inpatient psychiatry admissions at [facility] then [place]. Her MMSE today is 21/30 (26/30 on last Neurology visit in June 2018). Symptoms suggestive of behavioral changes associated with dementia, likely vascular. There may have been a component of depression as well, she has been started on citalopram and family reports behavior has been better the past week. She has prn buspirone for anxiety which she has not needed so far. She is calm and pleasant in the office today. We discussed goals of care, no clear indication to do repeat brain imaging at this point, her granddaughter agreed.  Continue donepezil 5mg  daily, continue citalopram. We discussed home safety, continue 24/7 care, monitor mood. If symptoms change, consideration again for Depakote can be done, granddaughter reports only taking 2 doses and being stopped for unclear reasons, she does not recall side effects. She will follow-up in 6 months and knows to call for any changes.   Thank you for allowing me to participate in the care of this patient. Please do not hesitate to call for any questions or concerns.   Ellouise Newer, M.D.  CC: Dr. Mariea Clonts

## 2017-04-15 NOTE — Telephone Encounter (Signed)
Spoke with Granddaughter today about another referral and she inquired about this home health. I informed her that Erin Hubbard called twice 04/08/17 & 04/12/17 and left messages. She stated she was going to make some phone calls herself to get this moving.

## 2017-04-16 NOTE — Addendum Note (Signed)
Addended by: Hollace Kinnier L on: 04/16/2017 03:02 PM   Modules accepted: Level of Service

## 2017-04-19 ENCOUNTER — Encounter: Payer: Self-pay | Admitting: Neurology

## 2017-05-05 ENCOUNTER — Encounter (HOSPITAL_BASED_OUTPATIENT_CLINIC_OR_DEPARTMENT_OTHER): Payer: Self-pay

## 2017-05-05 ENCOUNTER — Emergency Department (HOSPITAL_BASED_OUTPATIENT_CLINIC_OR_DEPARTMENT_OTHER)
Admission: EM | Admit: 2017-05-05 | Discharge: 2017-05-06 | Disposition: A | Discharge status: Home / Self Care | Payer: Medicare HMO | Attending: Emergency Medicine | Admitting: Emergency Medicine

## 2017-05-05 ENCOUNTER — Emergency Department (HOSPITAL_BASED_OUTPATIENT_CLINIC_OR_DEPARTMENT_OTHER): Payer: Medicare HMO

## 2017-05-05 DIAGNOSIS — R441 Visual hallucinations: Secondary | ICD-10-CM

## 2017-05-05 DIAGNOSIS — Z7902 Long term (current) use of antithrombotics/antiplatelets: Secondary | ICD-10-CM | POA: Diagnosis not present

## 2017-05-05 DIAGNOSIS — I129 Hypertensive chronic kidney disease with stage 1 through stage 4 chronic kidney disease, or unspecified chronic kidney disease: Secondary | ICD-10-CM | POA: Insufficient documentation

## 2017-05-05 DIAGNOSIS — N184 Chronic kidney disease, stage 4 (severe): Secondary | ICD-10-CM | POA: Insufficient documentation

## 2017-05-05 DIAGNOSIS — R442 Other hallucinations: Secondary | ICD-10-CM | POA: Diagnosis not present

## 2017-05-05 DIAGNOSIS — Z79899 Other long term (current) drug therapy: Secondary | ICD-10-CM | POA: Insufficient documentation

## 2017-05-05 DIAGNOSIS — R443 Hallucinations, unspecified: Secondary | ICD-10-CM | POA: Diagnosis not present

## 2017-05-05 DIAGNOSIS — E119 Type 2 diabetes mellitus without complications: Secondary | ICD-10-CM | POA: Insufficient documentation

## 2017-05-05 DIAGNOSIS — R350 Frequency of micturition: Secondary | ICD-10-CM | POA: Diagnosis not present

## 2017-05-05 LAB — CBC WITH DIFFERENTIAL/PLATELET
BASOS ABS: 0.1 10*3/uL (ref 0.0–0.1)
BASOS PCT: 1 %
EOS PCT: 1 %
Eosinophils Absolute: 0.1 10*3/uL (ref 0.0–0.7)
HEMATOCRIT: 45.6 % (ref 36.0–46.0)
HEMOGLOBIN: 16 g/dL — AB (ref 12.0–15.0)
LYMPHS ABS: 3.4 10*3/uL (ref 0.7–4.0)
Lymphocytes Relative: 23 %
MCH: 30.1 pg (ref 26.0–34.0)
MCHC: 35.1 g/dL (ref 30.0–36.0)
MCV: 85.7 fL (ref 78.0–100.0)
MONO ABS: 2.2 10*3/uL — AB (ref 0.1–1.0)
MONOS PCT: 15 %
NEUTROS ABS: 9.2 10*3/uL — AB (ref 1.7–7.7)
Neutrophils Relative %: 62 %
Platelets: 168 10*3/uL (ref 150–400)
RBC: 5.32 MIL/uL — AB (ref 3.87–5.11)
RDW: 12.8 % (ref 11.5–15.5)
WBC: 14.9 10*3/uL — ABNORMAL HIGH (ref 4.0–10.5)

## 2017-05-05 LAB — URINALYSIS, MICROSCOPIC (REFLEX): WBC, UA: NONE SEEN WBC/hpf (ref 0–5)

## 2017-05-05 LAB — URINALYSIS, ROUTINE W REFLEX MICROSCOPIC
Bilirubin Urine: NEGATIVE
GLUCOSE, UA: NEGATIVE mg/dL
KETONES UR: NEGATIVE mg/dL
Leukocytes, UA: NEGATIVE
Nitrite: NEGATIVE
PROTEIN: NEGATIVE mg/dL
Specific Gravity, Urine: 1.02 (ref 1.005–1.030)
pH: 6 (ref 5.0–8.0)

## 2017-05-05 NOTE — ED Notes (Signed)
Patient has been talking to herself and mumbling.  Alert and oriented to self, place and year.

## 2017-05-05 NOTE — ED Triage Notes (Addendum)
Per granddaughter/POA-states pt with with hallucinations today "having conversation with people that aren't there-pt A/O name place, DOB, age at this time-NAD-slow steady gait with own cane-pt was seen at [facility] PTA-"some blood in her urine and tenderness at her bladder"

## 2017-05-06 ENCOUNTER — Emergency Department (HOSPITAL_BASED_OUTPATIENT_CLINIC_OR_DEPARTMENT_OTHER): Payer: Medicare HMO

## 2017-05-06 ENCOUNTER — Ambulatory Visit (INDEPENDENT_AMBULATORY_CARE_PROVIDER_SITE_OTHER): Payer: Medicare HMO | Admitting: Internal Medicine

## 2017-05-06 ENCOUNTER — Encounter (HOSPITAL_BASED_OUTPATIENT_CLINIC_OR_DEPARTMENT_OTHER): Payer: Self-pay | Admitting: Emergency Medicine

## 2017-05-06 ENCOUNTER — Encounter: Payer: Self-pay | Admitting: Internal Medicine

## 2017-05-06 VITALS — BP 132/60 | HR 63 | Temp 98.0°F | Wt 135.0 lb

## 2017-05-06 DIAGNOSIS — R1310 Dysphagia, unspecified: Secondary | ICD-10-CM

## 2017-05-06 DIAGNOSIS — N3001 Acute cystitis with hematuria: Secondary | ICD-10-CM | POA: Diagnosis not present

## 2017-05-06 DIAGNOSIS — Z7902 Long term (current) use of antithrombotics/antiplatelets: Secondary | ICD-10-CM | POA: Diagnosis not present

## 2017-05-06 DIAGNOSIS — J690 Pneumonitis due to inhalation of food and vomit: Secondary | ICD-10-CM | POA: Diagnosis not present

## 2017-05-06 DIAGNOSIS — Z79899 Other long term (current) drug therapy: Secondary | ICD-10-CM | POA: Diagnosis not present

## 2017-05-06 DIAGNOSIS — F0151 Vascular dementia with behavioral disturbance: Secondary | ICD-10-CM

## 2017-05-06 DIAGNOSIS — R443 Hallucinations, unspecified: Secondary | ICD-10-CM | POA: Diagnosis not present

## 2017-05-06 DIAGNOSIS — F01518 Vascular dementia, unspecified severity, with other behavioral disturbance: Secondary | ICD-10-CM

## 2017-05-06 DIAGNOSIS — R441 Visual hallucinations: Secondary | ICD-10-CM | POA: Diagnosis not present

## 2017-05-06 DIAGNOSIS — R69 Illness, unspecified: Secondary | ICD-10-CM | POA: Diagnosis not present

## 2017-05-06 DIAGNOSIS — F322 Major depressive disorder, single episode, severe without psychotic features: Secondary | ICD-10-CM

## 2017-05-06 DIAGNOSIS — F22 Delusional disorders: Secondary | ICD-10-CM

## 2017-05-06 DIAGNOSIS — I129 Hypertensive chronic kidney disease with stage 1 through stage 4 chronic kidney disease, or unspecified chronic kidney disease: Secondary | ICD-10-CM | POA: Diagnosis not present

## 2017-05-06 DIAGNOSIS — E119 Type 2 diabetes mellitus without complications: Secondary | ICD-10-CM | POA: Diagnosis not present

## 2017-05-06 DIAGNOSIS — N184 Chronic kidney disease, stage 4 (severe): Secondary | ICD-10-CM | POA: Diagnosis not present

## 2017-05-06 LAB — BASIC METABOLIC PANEL
Anion gap: 9 (ref 5–15)
BUN: 35 mg/dL — ABNORMAL HIGH (ref 6–20)
CO2: 22 mmol/L (ref 22–32)
Calcium: 9 mg/dL (ref 8.9–10.3)
Chloride: 105 mmol/L (ref 101–111)
Creatinine, Ser: 1.08 mg/dL — ABNORMAL HIGH (ref 0.44–1.00)
GFR calc non Af Amer: 44 mL/min — ABNORMAL LOW (ref >60)
GFR, EST AFRICAN AMERICAN: 51 mL/min — AB (ref >60)
Glucose, Bld: 126 mg/dL — ABNORMAL HIGH (ref 65–99)
POTASSIUM: 4 mmol/L (ref 3.5–5.1)
SODIUM: 136 mmol/L (ref 135–145)

## 2017-05-06 MED ORDER — LEVOFLOXACIN 500 MG PO TABS: 500.0000 mg | ORAL_TABLET | Freq: Every day | ORAL | 0 refills | Status: DC

## 2017-05-06 NOTE — ED Provider Notes (Signed)
[facility] Provider Note   CSN: [id number] Arrival date & time: 05/05/17  2048     History   Chief Complaint Chief Complaint  Patient presents with  . Hallucinations    HPI Erin Hubbard is a 83 y.o. female.  The history is provided by a caregiver and a relative.  Mental Health Problem  Presenting symptoms: hallucinations   Presenting symptoms: no aggressive behavior, no delusions, no disorganized speech, no homicidal ideas, no paranoid behavior, no self-mutilation and no suicidal thoughts   Presenting symptoms comment:  Seeing dead husband Patient accompanied by:  Caregiver Degree of incapacity (severity):  Mild Onset quality:  Gradual Duration:  1 day Timing:  Intermittent Progression:  Unchanged Chronicity:  Recurrent Context: medication   Context comment:  Started celexa 4 weeks ago and is on aricept Treatment compliance:  All of the time Relieved by:  None tried Worsened by:  Nothing Ineffective treatments:  None tried Associated symptoms: no abdominal pain, no anxiety, no feelings of worthlessness, no hypersomnia, no irritability and no poor judgment   Risk factors: neurological disease   Risk factors: no hx of suicide attempts     Past Medical History:  Diagnosis Date  . 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 Confusion    04/19/14:EEG showed abnormal with mild to moderate slow wave over the left frontotemporal area and mild slow wave abnormality in the right temporal region without sz activity   . Diabetes mellitus without complication ([place])   . Essential hypertension, benign 02/20/2016  . Gait disturbance   . Gallbladder disorder   . History of recurrent UTI (urinary tract infection)   . Hyperlipidemia   . Hypotension   . Intracerebral hemorrhage ([place])   . Memory loss   . Stroke ([place])   . Vascular dementia without behavioral disturbance     Patient Active Problem List   Diagnosis Date Noted  . Vascular dementia with  behavior disturbance 04/08/2017  . CKD (chronic kidney disease) stage 4, GFR 15-29 ml/min ([facility]) 02/04/2017  . Essential hypertension, benign 02/20/2016  . Esophageal reflux 02/20/2016  . Temporary cerebral vascular dysfunction 06/17/2015  . Injury of left facial nerve 06/28/2014  . Dysphagia as late effect of stroke 06/28/2014  . Hyperlipidemia 06/28/2014  . Type 2 diabetes mellitus with neurological manifestations, controlled ([place]) 06/28/2014  . Peripheral autonomic neuropathy due to DM ([place]) 06/28/2014  . Orthostatic hypotension 06/28/2014  . Gall bladder inflammation 06/28/2014  . Diarrhea 06/28/2014  . Hearing loss 06/28/2014  . Vascular dementia without behavioral disturbance   . Cerebral hemorrhage ([place]) 10/25/2013  . Amnesia 10/25/2013  . General psychoses 10/25/2013  . Localized swelling, mass, and lump of head 03/24/2012    Past Surgical History:  Procedure Laterality Date  . CATARACT EXTRACTION  2012   Dr. Bing Plume  . removal partoid tumor  1986  . TONSILLECTOMY     childhood    OB History    No data available       Home Medications    Prior to Admission medications   Medication Sig Start Date End Date Taking? Authorizing Provider  acetaminophen (TYLENOL) 500 MG tablet Take 1 tablet (500 mg total) by mouth daily. For possible pain 03/04/17   Reed, Tiffany L, DO  amLODipine (NORVASC) 5 MG tablet Take 1 tablet (5 mg total) by mouth daily. 03/12/17 04/11/17  Reed, Tiffany L, DO  Artificial Tear Ointment (LUBRICANT EYE) OINT Apply to eye at bedtime.  [provider]  busPIRone (BUSPAR) 5 MG tablet Take 1 tablet (5 mg total) by mouth daily as needed (anxiety). 04/08/17   Reed, Tiffany L, DO  citalopram (CELEXA) 10 MG tablet Take 1 tablet (10 mg total) by mouth daily. 04/08/17   Reed, Tiffany L, DO  clopidogrel (PLAVIX) 75 MG tablet Take 1 tablet (75 mg total) by mouth daily. Patient needs an appointment before anymore refills 11/27/16   Reed, Tiffany L, DO    donepezil (ARICEPT) 5 MG tablet Take 1 tablet (5 mg total) by mouth at bedtime. 02/10/17   Reed, Tiffany L, DO  glucose blood (ONE TOUCH ULTRA TEST) test strip Use as instructed to test blood sugar once daily DX: E11.49 02/16/17   Reed, Tiffany L, DO  [facility] DELICA LANCETS 66A MISC Use as instructed to test blood sugar once daily DX: E11.49 02/16/17   Reed, Tiffany L, DO  pantoprazole (PROTONIX) 40 MG tablet TAKE 1 TABLET BY MOUTH 2 TIMES A DAY WITH FOOD FOR STOMACH 03/23/17   Reed, Tiffany L, DO  Polyethyl Glycol-Propyl Glycol 0.4-0.3 % SOLN Apply to eye.    [provider]    Family History Family History  Problem Relation Age of Onset  . Heart disease Mother   . Heart disease Father   . High blood pressure Sister   . Heart disease Sister   . High Cholesterol Sister   . Diabetes Daughter   . Polymyositis Daughter   . Stroke Other        aunt  . Dementia Son        Fontal Temporal Dementia (FTD)   . High blood pressure Son   . Thyroid disease Grandchild        Autoimmune thyroid condition     Social History Social History  Substance Use Topics  . Smoking status: Never Smoker  . Smokeless tobacco: Never Used  . Alcohol use No     Allergies   Penicillins   Review of Systems Review of Systems  Constitutional: Negative for fever and irritability.  Gastrointestinal: Negative for abdominal pain.  Psychiatric/Behavioral: Positive for hallucinations. Negative for homicidal ideas, paranoia, self-injury and suicidal ideas. The patient is not nervous/anxious and is not hyperactive.   All other systems reviewed and are negative.    Physical Exam Updated Vital Signs BP (!) 168/60 (BP Location: Left Arm)   Pulse 73   Temp 98.2 F (36.8 C) (Oral)   Resp 18   Ht 5\' 4"  (1.626 m)   Wt 59.1 kg (130 lb 4.7 oz)   SpO2 98%   BMI 22.36 kg/m   Physical Exam  Constitutional: She appears well-developed and well-nourished. No distress.  HENT:  Head: Normocephalic and  atraumatic.  Mouth/Throat: Oropharynx is clear and moist. No oropharyngeal exudate.  Eyes: Pupils are equal, round, and reactive to light. Conjunctivae are normal.  Neck: Normal range of motion. Neck supple. No JVD present.  Cardiovascular: Normal rate, regular rhythm, normal heart sounds and intact distal pulses.   Pulmonary/Chest: Effort normal and breath sounds normal. No stridor. No respiratory distress. She has no wheezes. She has no rales.  Abdominal: Soft. Bowel sounds are normal. She exhibits no mass. There is no tenderness. There is no rebound and no guarding.  Musculoskeletal: Normal range of motion. She exhibits no edema or deformity.  Lymphadenopathy:    She has no cervical adenopathy.  Neurological: She is alert. She displays normal reflexes. She exhibits normal muscle tone.  Skin: Skin is warm  and dry. Capillary refill takes less than 2 seconds.  Psychiatric:  Pointing but is not speaking to anyone while I am present she denies seeing her husband in the room at this time     ED Treatments / Results   Vitals:   05/05/17 2336 05/05/17 2337  BP: (!) 168/60 (!) 168/60  Pulse: 74 73  Resp:  18  Temp:    SpO2: 98% 98%    Labs (all labs ordered are listed, but only abnormal results are displayed) Results for orders placed or performed during the hospital encounter of 05/05/17  Urinalysis, Routine w reflex microscopic  Result Value Ref Range   Color, Urine YELLOW YELLOW   APPearance CLEAR CLEAR   Specific Gravity, Urine 1.020 1.005 - 1.030   pH 6.0 5.0 - 8.0   Glucose, UA NEGATIVE NEGATIVE mg/dL   Hgb urine dipstick SMALL (A) NEGATIVE   Bilirubin Urine NEGATIVE NEGATIVE   Ketones, ur NEGATIVE NEGATIVE mg/dL   Protein, ur NEGATIVE NEGATIVE mg/dL   Nitrite NEGATIVE NEGATIVE   Leukocytes, UA NEGATIVE NEGATIVE  Urinalysis, Microscopic (reflex)  Result Value Ref Range   RBC / HPF 0-5 0 - 5 RBC/hpf   WBC, UA NONE SEEN 0 - 5 WBC/hpf   Bacteria, UA FEW (A) NONE SEEN    Squamous Epithelial / LPF 0-5 (A) NONE SEEN  CBC with Differential/Platelet  Result Value Ref Range   WBC 14.9 (H) 4.0 - 10.5 K/uL   RBC 5.32 (H) 3.87 - 5.11 MIL/uL   Hemoglobin 16.0 (H) 12.0 - 15.0 g/dL   HCT 45.6 36.0 - 46.0 %   MCV 85.7 78.0 - 100.0 fL   MCH 30.1 26.0 - 34.0 pg   MCHC 35.1 30.0 - 36.0 g/dL   RDW 12.8 11.5 - 15.5 %   Platelets 168 150 - 400 K/uL   Neutrophils Relative % 62 %   Neutro Abs 9.2 (H) 1.7 - 7.7 K/uL   Lymphocytes Relative 23 %   Lymphs Abs 3.4 0.7 - 4.0 K/uL   Monocytes Relative 15 %   Monocytes Absolute 2.2 (H) 0.1 - 1.0 K/uL   Eosinophils Relative 1 %   Eosinophils Absolute 0.1 0.0 - 0.7 K/uL   Basophils Relative 1 %   Basophils Absolute 0.1 0.0 - 0.1 K/uL  Basic metabolic panel  Result Value Ref Range   Sodium 136 135 - 145 mmol/L   Potassium 4.0 3.5 - 5.1 mmol/L   Chloride 105 101 - 111 mmol/L   CO2 22 22 - 32 mmol/L   Glucose, Bld 126 (H) 65 - 99 mg/dL   BUN 35 (H) 6 - 20 mg/dL   Creatinine, Ser 1.08 (H) 0.44 - 1.00 mg/dL   Calcium 9.0 8.9 - 10.3 mg/dL   GFR calc non Af Amer 44 (L) >60 mL/min   GFR calc Af Amer 51 (L) >60 mL/min   Anion gap 9 5 - 15   Dg Chest 2 View  Result Date: 05/06/2017 CLINICAL DATA:  Hallucination and blood in urine. EXAM: CHEST  2 VIEW COMPARISON:  Chest radiograph 03/17/2017 FINDINGS: The cardiomediastinal contours are normal. Atherosclerosis of the aortic arch. Pulmonary vasculature is normal. No consolidation, pleural effusion, or pneumothorax. Mild broad-based rightward curvature of the thoracic spine. No acute osseous abnormalities are seen. IMPRESSION: No acute pulmonary process. Electronically Signed   By: Jeb Levering M.D.   On: 05/06/2017 00:42   Ct Head Wo Contrast  Result Date: 05/06/2017 CLINICAL DATA:  83 year old with hallucinations today.  EXAM: CT HEAD WITHOUT CONTRAST TECHNIQUE: Contiguous axial images were obtained from the base of the skull through the vertex without intravenous contrast.  COMPARISON:  Head CT 01/17/2017.  Brain MRI 02/06/2017 FINDINGS: Brain: Stable age related atrophy and chronic small vessel ischemia. Subcentimeter infarct on prior MRI is not visualized by CT. No intracranial hemorrhage, mass effect, or midline shift. No hydrocephalus. The basilar cisterns are patent. No evidence of territorial infarct or acute ischemia. No extra-axial or intracranial fluid collection. Vascular: Atherosclerosis of skullbase vasculature without hyperdense vessel or abnormal calcification. Skull: No skull fracture or focal lesion. Sinuses/Orbits: Chronic mucosal thickening of the right maxillary sinus. Tiny mucous retention cyst in left maxillary sinus. No sinus fluid levels. Mastoid air cells are well-aerated, question prior mastoidectomy on the left. Bilateral cataract extraction. Other: None. IMPRESSION: No acute intracranial abnormality. Electronically Signed   By: Jeb Levering M.D.   On: 05/06/2017 00:39     Radiology Dg Chest 2 View  Result Date: 05/06/2017 CLINICAL DATA:  Hallucination and blood in urine. EXAM: CHEST  2 VIEW COMPARISON:  Chest radiograph 03/17/2017 FINDINGS: The cardiomediastinal contours are normal. Atherosclerosis of the aortic arch. Pulmonary vasculature is normal. No consolidation, pleural effusion, or pneumothorax. Mild broad-based rightward curvature of the thoracic spine. No acute osseous abnormalities are seen. IMPRESSION: No acute pulmonary process. Electronically Signed   By: Jeb Levering M.D.   On: 05/06/2017 00:42   Ct Head Wo Contrast  Result Date: 05/06/2017 CLINICAL DATA:  83 year old with hallucinations today. EXAM: CT HEAD WITHOUT CONTRAST TECHNIQUE: Contiguous axial images were obtained from the base of the skull through the vertex without intravenous contrast. COMPARISON:  Head CT 01/17/2017.  Brain MRI 02/06/2017 FINDINGS: Brain: Stable age related atrophy and chronic small vessel ischemia. Subcentimeter infarct on prior MRI is not  visualized by CT. No intracranial hemorrhage, mass effect, or midline shift. No hydrocephalus. The basilar cisterns are patent. No evidence of territorial infarct or acute ischemia. No extra-axial or intracranial fluid collection. Vascular: Atherosclerosis of skullbase vasculature without hyperdense vessel or abnormal calcification. Skull: No skull fracture or focal lesion. Sinuses/Orbits: Chronic mucosal thickening of the right maxillary sinus. Tiny mucous retention cyst in left maxillary sinus. No sinus fluid levels. Mastoid air cells are well-aerated, question prior mastoidectomy on the left. Bilateral cataract extraction. Other: None. IMPRESSION: No acute intracranial abnormality. Electronically Signed   By: Jeb Levering M.D.   On: 05/06/2017 00:39    Procedures Procedures (including critical care time)   Final Clinical Impressions(s) / ED Diagnoses   Final diagnoses:  Visual hallucinations   I believe her care givers but the patient is calm and resting comfortably at this time.  I find no organic emergency condition to admit the patient for.  I suspect the Celexa as the culprit and have advised the family to contact the prescribing physician as they state patient has paradoxical reactions to lots of medications and this was started 4 weeks ago and is just now becoming active in the patient's system.  I suspect it is unmasking underlying behavioral issues and I recommend discussin this with PMD to taper off of it.  As I have not witnessed any dangerous or bizarre behavior she is not commitable by me at this time.  She is stable for discharge at this time.    The patient is very well appearing and has been observed in the ED.  Strict return precautions given for  intractable rash, swelling or the lips tongue or floor of the  mouth, chest pain, dyspnea on exertion, new weakness or numbness changes in vision or speech,  Inability to tolerate liquids or food, fevers > 101, rashes on the skin,  changes in voice cough, altered mental status or any concerns. No signs of systemic illness or infection. The patient is nontoxic-appearing on exam and vital signs are within normal limits.    I have reviewed the triage vital signs and the nursing notes. Pertinent labs &imaging results that were available during my care of the patient were reviewed by me and considered in my medical decision making (see chart for details).  After history, exam, and medical workup I feel the patient has been appropriately medically screened and is safe for discharge home. Pertinent diagnoses were discussed with the patient. Patient was given return precautions.      Chau Savell, MD 05/06/17 [phone]

## 2017-05-06 NOTE — Progress Notes (Signed)
Location:  [facility] clinic Provider: Arlo Buffone L. Mariea Clonts, D.O., C.M.D.  Code Status: DNR Goals of Care:  Advanced Directives 05/05/2017  Does Patient Have a Medical Advance Directive? Yes  Type of Advance Directive [place]  Does patient want to make changes to medical advance directive? -  Copy of [place] in Chart? -  Would patient like information on creating a medical advance directive? -  Pre-existing out of facility DNR order (yellow form or pink MOST form) -   Chief Complaint  Patient presents with  . Follow-up    f/u from ED with hallucinations    HPI: Patient is a 83 y.o. female with dementia with paranoid delusions and hallucinations, DMII, htn seen today for an acute visit for f/u after ED visit.  She had been talking to people that weren't there since the previous morning and not sleeping.  They went first to the urgent care and were thought to possibly have a UTI.  In the ED, notes indicate she was seeing her dead husband.  Having repetitive conversations over and over.   UA with reflex micro was done (neg), cbc with diff and bmp, CT head w/o contrast and CXR.  BP was elevated in 160s while there.  She did have a WBC count of 14.9 and blood count elevated at 16.  BMP wnl--no sign of dehydration.  ED told family it might be a paradoxical reaction to celexa which was given for her clear depression--she was crying and upset at her last visit.  CT showed only Stable age related atrophy and chronic small vessel ischemia.  CXR showed no acute pulmonary process.  Buspar didn't touch anything this time, but did calm her temperament another time.    It was continuous talking from the time she got up this am until she got here.  Now she's sleepy.  This was way more pronounced than the every before.    Has chronic sniffling and clearing her throat.  Especially after eating.    Past Medical History:  Diagnosis Date  . Benign parotid tumor 1986-1987   s/p  resection x 2  . Confusion    04/19/14:EEG showed abnormal with mild to moderate slow wave over the left frontotemporal area and mild slow wave abnormality in the right temporal region without sz activity   . Diabetes mellitus without complication ([place])   . Essential hypertension, benign 02/20/2016  . Gait disturbance   . Gallbladder disorder   . History of recurrent UTI (urinary tract infection)   . Hyperlipidemia   . Hypotension   . Intracerebral hemorrhage ([place])   . Memory loss   . Stroke ([place])   . Vascular dementia without behavioral disturbance     Past Surgical History:  Procedure Laterality Date  . CATARACT EXTRACTION  2012   Dr. Bing Plume  . removal partoid tumor  1986  . TONSILLECTOMY     childhood    Allergies  Allergen Reactions  . Penicillins     Allergies as of 05/06/2017      Reactions   Penicillins       Medication List       Accurate as of 05/06/17 11:59 PM. Always use your most recent med list.          acetaminophen 500 MG tablet Commonly known as:  TYLENOL Take 1 tablet (500 mg total) by mouth daily. For possible pain   amLODipine 5 MG tablet Commonly known as:  NORVASC Take 1  tablet (5 mg total) by mouth daily.   busPIRone 5 MG tablet Commonly known as:  BUSPAR Take 1 tablet (5 mg total) by mouth daily as needed (anxiety).   citalopram 10 MG tablet Commonly known as:  CELEXA Take 1 tablet (10 mg total) by mouth daily.   clopidogrel 75 MG tablet Commonly known as:  PLAVIX Take 1 tablet (75 mg total) by mouth daily. Patient needs an appointment before anymore refills   donepezil 5 MG tablet Commonly known as:  ARICEPT Take 1 tablet (5 mg total) by mouth at bedtime.   glucose blood test strip Commonly known as:  ONE TOUCH ULTRA TEST Use as instructed to test blood sugar once daily DX: E11.49   levofloxacin 500 MG tablet Commonly known as:  LEVAQUIN Take 1 tablet (500 mg total) by mouth daily.   LUBRICANT EYE Oint Apply to eye at  bedtime.   ONETOUCH DELICA LANCETS 53G Misc Use as instructed to test blood sugar once daily DX: E11.49   pantoprazole 40 MG tablet Commonly known as:  PROTONIX TAKE 1 TABLET BY MOUTH 2 TIMES A DAY WITH FOOD FOR STOMACH   Polyethyl Glycol-Propyl Glycol 0.4-0.3 % Soln Apply to eye.            Discharge Care Instructions        Start     Ordered   05/06/17 0000  levofloxacin (LEVAQUIN) 500 MG tablet  Daily     05/06/17 1051      Review of Systems:  Review of Systems  Reason unable to perform ROS: pt and daughter.  Constitutional: Positive for malaise/fatigue. Negative for chills and fever.  HENT: Positive for congestion.   Eyes: Negative for blurred vision.  Respiratory: Positive for cough. Negative for shortness of breath and wheezing.        Cough with meals  Cardiovascular: Negative for chest pain and palpitations.  Gastrointestinal: Negative for abdominal pain.  Genitourinary: Positive for hematuria. Negative for dysuria, frequency and urgency.       On UA at urgent care but not in ED  Musculoskeletal: Negative for falls.  Skin: Negative for itching and rash.  Neurological: Negative for dizziness, loss of consciousness and headaches.       Chronic facial droop after left parotid tumor surgery  Endo/Heme/Allergies: Bruises/bleeds easily.  Psychiatric/Behavioral: Positive for depression, hallucinations and memory loss. Negative for suicidal ideas. The patient is nervous/anxious and has insomnia.     Health Maintenance  Topic Date Due  . URINE MICROALBUMIN  11/30/2015  . FOOT EXAM  02/19/2017  . INFLUENZA VACCINE  03/24/2017  . OPHTHALMOLOGY EXAM  08/20/2017 (Originally 07/05/1935)  . TETANUS/TDAP  08/24/2017 (Originally 07/05/1944)  . DEXA SCAN  03/07/2025 (Originally 07/05/1992)  . HEMOGLOBIN A1C  07/30/2017  . PNA vac Low Risk Adult  Completed    Physical Exam: Vitals:   05/06/17 1012  BP: 132/60  Pulse: 63  Temp: 98 F (36.7 C)  TempSrc: Oral    SpO2: 95%  Weight: 135 lb (61.2 kg)   Body mass index is 23.17 kg/m. Physical Exam  Constitutional:  Sleepy frail white female seated in chair  Cardiovascular: Normal rate, regular rhythm, normal heart sounds and intact distal pulses.   Pulmonary/Chest: Effort normal and breath sounds normal. No respiratory distress. She has no wheezes. She has no rales.  Abdominal: Soft. Bowel sounds are normal. She exhibits no distension and no mass. There is no tenderness. There is no guarding.  Neurological:  Woke up easily  and answered questions, no signs of active hallucinations at appt, not tearful this time  Skin: Skin is warm and dry.  Psychiatric:  Pleasant and appropriate while here    Labs reviewed: Basic Metabolic Panel:  Recent Labs  01/28/17 0250 03/17/17 1206 05/05/17 2345  NA 137 138 136  K 5.6* 4.9 4.0  CL 107 108 105  CO2 26 22 22   GLUCOSE 131* 127* 126*  BUN 45* 38* 35*  CREATININE 1.17* 1.24* 1.08*  CALCIUM 8.6 8.6 9.0  MG  --  2.1  --    Liver Function Tests:  Recent Labs  03/17/17 1206  AST 16  ALT 13  ALKPHOS 80  BILITOT 0.5  PROT 6.6  ALBUMIN 3.6   No results for input(s): LIPASE, AMYLASE in the last 8760 hours. No results for input(s): AMMONIA in the last 8760 hours. CBC:  Recent Labs  01/28/17 0250 03/17/17 1150 05/05/17 2345  WBC 8.7 9.4 14.9*  NEUTROABS 4,437 5,734 9.2*  HGB 14.2 14.5 16.0*  HCT 43.4 43.8 45.6  MCV 90.0 89.4 85.7  PLT 177 193 168   Lipid Panel: No results for input(s): CHOL, HDL, LDLCALC, TRIG, CHOLHDL, LDLDIRECT in the last 8760 hours. Lab Results  Component Value Date   HGBA1C 6.2 (H) 01/28/2017    Procedures since last visit: Dg Chest 2 View  Result Date: 05/06/2017 CLINICAL DATA:  Hallucination and blood in urine. EXAM: CHEST  2 VIEW COMPARISON:  Chest radiograph 03/17/2017 FINDINGS: The cardiomediastinal contours are normal. Atherosclerosis of the aortic arch. Pulmonary vasculature is normal. No  consolidation, pleural effusion, or pneumothorax. Mild broad-based rightward curvature of the thoracic spine. No acute osseous abnormalities are seen. IMPRESSION: No acute pulmonary process. Electronically Signed   By: Jeb Levering M.D.   On: 05/06/2017 00:42   Ct Head Wo Contrast  Result Date: 05/06/2017 CLINICAL DATA:  83 year old with hallucinations today. EXAM: CT HEAD WITHOUT CONTRAST TECHNIQUE: Contiguous axial images were obtained from the base of the skull through the vertex without intravenous contrast. COMPARISON:  Head CT 01/17/2017.  Brain MRI 02/06/2017 FINDINGS: Brain: Stable age related atrophy and chronic small vessel ischemia. Subcentimeter infarct on prior MRI is not visualized by CT. No intracranial hemorrhage, mass effect, or midline shift. No hydrocephalus. The basilar cisterns are patent. No evidence of territorial infarct or acute ischemia. No extra-axial or intracranial fluid collection. Vascular: Atherosclerosis of skullbase vasculature without hyperdense vessel or abnormal calcification. Skull: No skull fracture or focal lesion. Sinuses/Orbits: Chronic mucosal thickening of the right maxillary sinus. Tiny mucous retention cyst in left maxillary sinus. No sinus fluid levels. Mastoid air cells are well-aerated, question prior mastoidectomy on the left. Bilateral cataract extraction. Other: None. IMPRESSION: No acute intracranial abnormality. Electronically Signed   By: Jeb Levering M.D.   On: 05/06/2017 00:39    Assessment/Plan 1. Paranoid psychosis ([place]) -working diagnosis was that this was due more to depression and grief after husband's death than her dementia, but she has both conditions so not entirely clear from which the psychosis originates -worse with hallucinations and talking to people who were not there -suspect due to infection given leukocytosis that we have not seen on her labs in the [facility] system recently (up to 65) -will tx with levaquin due to uncertainty  whether this is aspiration or UTI (one sample appeared positive and another did not), on scheduled tylenol so fever not happening -to eat yogurt with the levaquin (eats several daily per family)  2. Depression, major, single episode,  severe ([place]) -doubt celexa contributes to the hallucinations--cont it b/c she is less tearful and smiled more at me during our conversation -also has buspar which I really did not want to use, but daughter had gotten a recommendation from a pharmacist for it--she reports it did help calm her when she was more belligerent at times, but did not help with the prolonged hallucinations last night  3. Vascular dementia with behavior disturbance -remains on aricept alone for this, low dose -again, not clear if hallucinations and paranoid delusions are due to dementia or depression  4. Dysphagia, unspecified type -ongoing with solids, cont aspiration precautions, softer foods, smaller bites/mechanical soft, and following with sips of water  5. Aspiration pneumonitis ([place]) -may be cause of leukocytosis and has allergy to pcns so augmentin is out, no obvious signs of this on exam - levofloxacin (LEVAQUIN) 500 MG tablet; Take 1 tablet (500 mg total) by mouth daily.  Dispense: 7 tablet; Refill: 0  6. Acute cystitis with hematuria - had hematuria on UA and some other signs of infection so will tx accordingly, no obvious signs of UTI either but wbc up for some reason - levofloxacin (LEVAQUIN) 500 MG tablet; Take 1 tablet (500 mg total) by mouth daily.  Dispense: 7 tablet; Refill: 0  Labs/tests ordered:  No orders of the defined types were placed in this encounter.  Next appt:  07/08/2017 as scheduled and prn  Eugina Row L. Malissie Musgrave, D.O. [facility] [address], [place] [address] Cell Phone (Mon-Fri 8am-5pm):  [phone] On Call:  [phone] & follow prompts after 5pm & weekends Office Phone:  [phone] Office Fax:   [phone]

## 2017-05-06 NOTE — Patient Instructions (Addendum)
Try melatonin 5mg  at hs or chamomile tea.

## 2017-05-06 NOTE — ED Notes (Signed)
Pt's family verbalizes understanding of dc instructions and deny any further needs at this time

## 2017-05-10 ENCOUNTER — Telehealth: Payer: Self-pay | Admitting: *Deleted

## 2017-05-10 ENCOUNTER — Other Ambulatory Visit: Payer: Self-pay | Admitting: Internal Medicine

## 2017-05-10 DIAGNOSIS — F22 Delusional disorders: Secondary | ICD-10-CM

## 2017-05-10 MED ORDER — ARIPIPRAZOLE 5 MG PO TABS: 5.0000 mg | ORAL_TABLET | Freq: Every day | ORAL | 3 refills | Status: DC

## 2017-05-10 NOTE — Telephone Encounter (Signed)
Granddaughter calling stating pt is being combative again, hallucinations and acting up per granddaughter. I asked where was pt during the phone call and granddaughter stated pt was at her sisters house, I asked was she being combative there and she stated yes. granddaughter asking if they need to call 911 to have her committed again? Or should they start the antipsychotic medication?

## 2017-05-10 NOTE — Telephone Encounter (Signed)
Let's try her on abilify 5mg  qhs for the psychosis.

## 2017-05-11 NOTE — Telephone Encounter (Signed)
Discussed with patient's grand-daughter, patient's grand-daughter was disturb that no one had followed up with her yet and she had to call to inquire about Dr.Reed's response.  I apologized for the delay and informed Erin Hubbard that message was responded to after hours and Dr.Reed and her assistant is out of office today, this process will be evaluated to avoid delays in the future

## 2017-05-13 ENCOUNTER — Telehealth: Payer: Self-pay | Admitting: *Deleted

## 2017-05-13 MED ORDER — QUETIAPINE FUMARATE 50 MG PO TABS: 50.0000 mg | ORAL_TABLET | Freq: Every day | ORAL | 3 refills | Status: DC

## 2017-05-13 NOTE — Telephone Encounter (Signed)
Patient daughter, Joellen Jersey called and stated that the Abilify is Not working. Stated that the patient is Combative, Hallucinating and staying up all night.  Stated that the Seroquel worked better. Stated that patient slept better and was calmer on the Seroquel and would like to go back on it. States something needs to be done today because no one is getting any sleep.  Please Advise.

## 2017-05-13 NOTE — Telephone Encounter (Signed)
Patient daughter, Joellen Jersey notified. Medication list updated and Rx faxed to pharmacy-[facility]

## 2017-05-13 NOTE — Telephone Encounter (Signed)
Ok, d/c abilify.  Start seroquel 50mg  po qhs.

## 2017-05-14 ENCOUNTER — Telehealth: Payer: Self-pay | Admitting: Neurology

## 2017-05-14 DIAGNOSIS — F01518 Vascular dementia, unspecified severity, with other behavioral disturbance: Secondary | ICD-10-CM

## 2017-05-14 DIAGNOSIS — F0151 Vascular dementia with behavioral disturbance: Secondary | ICD-10-CM

## 2017-05-14 NOTE — Telephone Encounter (Signed)
Please call the patient and get more information.  It looks like her geriatrician added Seroquel 50mg  at bedtime yesterday and have been managing these symptoms.  She can d/c donepezil as this can sometime cause hallucinations and she is on a low dose.

## 2017-05-14 NOTE — Telephone Encounter (Signed)
Spoke with granddaughter, Joellen Jersey.  She states that pt has been hallucinating for approximately 48 hours, has not eaten in about 3 days and has not bathed in about a month.  States that pt is talking to people who are not there.  Per Dr. Tomi Likens, advised Katie to take pt to ER.  Joellen Jersey states that the last few times they have taken pt to ER they were told that pt possibly had a UTI, was given antibiotics and sent home.  I suggested taking her to [facility] as they are close to [place].  Granddaughter agreed.

## 2017-05-14 NOTE — Telephone Encounter (Signed)
URGENT  Patient's Granddaughter Erin Hubbard called regarding her Grandmother Erin Hubbard. She said she is on Seroquel (lowest dosage) and that she has not slept in 48 hours and that she is hallucinating really bad. She is talking to people that are not there. She said she has been up with her. They would like some one to call them back please. Thanks

## 2017-05-19 NOTE — Telephone Encounter (Signed)
Spoke with [employer].  She says there has been no improvement.  Pt goes through cycles of 2-3 days not sleeping or eating, and then 2-3 days where she crashes, only waking long enough to eat.  She has been on Seroquel for almost a month.  Joellen Jersey knows that it usually take 30 or so days before Seroquel reaches therapeutic levels, however she says that there is a test to determine which medications may work better (She referenced children with ADD/ADHD in regards to this) and is wondering if this would be a possibility for pt?  Pt recently had blood work done by PCP and her WBC indicated an infection.  Joellen Jersey says that during her round of antibiotic's pt started acting more like her old self, but before the round was finished she was back to this state.  Joellen Jersey is also wondering if pt should see psych?

## 2017-05-19 NOTE — Telephone Encounter (Signed)
I don't see any follow-up on this, does not seem that they went to [facility]. Can you pls check with Joellen Jersey, thanks

## 2017-05-19 NOTE — Telephone Encounter (Signed)
Looking at notes, was the Seroquel increased to 50mg  qhs last 9/20? Try increasing to 75mg  qhs. We don't usually do the blood test she is referring to. I think she would greatly benefit from seeing geriatric psychiatry, pls send referral and give Katie the number so she can follow-up on referral. Also, how much citalopram is she on? Thanks

## 2017-05-20 ENCOUNTER — Telehealth: Payer: Self-pay

## 2017-05-20 NOTE — Telephone Encounter (Signed)
LMOVM for Erin Hubbard, asking that she return call to the office.

## 2017-05-20 NOTE — Addendum Note (Signed)
Addended by: Lenny Pastel on: 05/20/2017 03:49 PM   Modules accepted: Orders

## 2017-05-20 NOTE — Telephone Encounter (Signed)
Spoke with [employer].  She says that pt is taking 50mg  Seroquel qhs. She is agreeable to increase.  She will call back in about a week or so to let us know how the increase works.  Pt has not taken any citalopram in about 3 or 4 days - Joellen Jersey says that she focuses more on the "important" meds since pt is not eating.  Referral sent to Dr. Casimiro Needle - phone number given to [facility] for scheduling.

## 2017-06-04 ENCOUNTER — Other Ambulatory Visit: Payer: Self-pay | Admitting: Internal Medicine

## 2017-06-18 DIAGNOSIS — R9401 Abnormal electroencephalogram [EEG]: Secondary | ICD-10-CM | POA: Diagnosis not present

## 2017-06-18 DIAGNOSIS — R269 Unspecified abnormalities of gait and mobility: Secondary | ICD-10-CM | POA: Diagnosis not present

## 2017-06-18 DIAGNOSIS — I639 Cerebral infarction, unspecified: Secondary | ICD-10-CM | POA: Diagnosis not present

## 2017-06-18 DIAGNOSIS — R4182 Altered mental status, unspecified: Secondary | ICD-10-CM | POA: Diagnosis not present

## 2017-06-18 DIAGNOSIS — R5381 Other malaise: Secondary | ICD-10-CM | POA: Diagnosis not present

## 2017-06-18 DIAGNOSIS — I6389 Other cerebral infarction: Secondary | ICD-10-CM | POA: Diagnosis not present

## 2017-06-18 DIAGNOSIS — I6350 Cerebral infarction due to unspecified occlusion or stenosis of unspecified cerebral artery: Secondary | ICD-10-CM | POA: Diagnosis not present

## 2017-06-18 DIAGNOSIS — R69 Illness, unspecified: Secondary | ICD-10-CM | POA: Diagnosis not present

## 2017-06-18 DIAGNOSIS — D72829 Elevated white blood cell count, unspecified: Secondary | ICD-10-CM | POA: Diagnosis not present

## 2017-06-18 DIAGNOSIS — I69354 Hemiplegia and hemiparesis following cerebral infarction affecting left non-dominant side: Secondary | ICD-10-CM | POA: Diagnosis not present

## 2017-06-18 DIAGNOSIS — J690 Pneumonitis due to inhalation of food and vomit: Secondary | ICD-10-CM | POA: Diagnosis not present

## 2017-06-18 DIAGNOSIS — I361 Nonrheumatic tricuspid (valve) insufficiency: Secondary | ICD-10-CM | POA: Diagnosis not present

## 2017-06-18 DIAGNOSIS — R531 Weakness: Secondary | ICD-10-CM | POA: Diagnosis not present

## 2017-06-18 DIAGNOSIS — Z7409 Other reduced mobility: Secondary | ICD-10-CM | POA: Diagnosis not present

## 2017-06-18 DIAGNOSIS — I351 Nonrheumatic aortic (valve) insufficiency: Secondary | ICD-10-CM | POA: Diagnosis not present

## 2017-06-18 DIAGNOSIS — R1312 Dysphagia, oropharyngeal phase: Secondary | ICD-10-CM | POA: Diagnosis not present

## 2017-06-18 DIAGNOSIS — I10 Essential (primary) hypertension: Secondary | ICD-10-CM | POA: Diagnosis not present

## 2017-06-18 DIAGNOSIS — R464 Slowness and poor responsiveness: Secondary | ICD-10-CM | POA: Diagnosis not present

## 2017-06-18 DIAGNOSIS — I618 Other nontraumatic intracerebral hemorrhage: Secondary | ICD-10-CM | POA: Diagnosis not present

## 2017-06-18 DIAGNOSIS — R1310 Dysphagia, unspecified: Secondary | ICD-10-CM | POA: Diagnosis not present

## 2017-06-18 DIAGNOSIS — G9340 Encephalopathy, unspecified: Secondary | ICD-10-CM | POA: Diagnosis not present

## 2017-06-18 DIAGNOSIS — Z7982 Long term (current) use of aspirin: Secondary | ICD-10-CM | POA: Diagnosis not present

## 2017-06-18 DIAGNOSIS — R5383 Other fatigue: Secondary | ICD-10-CM | POA: Diagnosis not present

## 2017-06-18 DIAGNOSIS — R400 Somnolence: Secondary | ICD-10-CM | POA: Diagnosis not present

## 2017-06-18 DIAGNOSIS — K219 Gastro-esophageal reflux disease without esophagitis: Secondary | ICD-10-CM | POA: Diagnosis not present

## 2017-06-18 DIAGNOSIS — Z8673 Personal history of transient ischemic attack (TIA), and cerebral infarction without residual deficits: Secondary | ICD-10-CM | POA: Diagnosis not present

## 2017-06-18 DIAGNOSIS — R339 Retention of urine, unspecified: Secondary | ICD-10-CM | POA: Diagnosis not present

## 2017-06-19 DIAGNOSIS — I639 Cerebral infarction, unspecified: Secondary | ICD-10-CM | POA: Diagnosis not present

## 2017-06-19 DIAGNOSIS — R1310 Dysphagia, unspecified: Secondary | ICD-10-CM | POA: Diagnosis not present

## 2017-06-19 DIAGNOSIS — G9340 Encephalopathy, unspecified: Secondary | ICD-10-CM | POA: Diagnosis not present

## 2017-06-19 DIAGNOSIS — Z79899 Other long term (current) drug therapy: Secondary | ICD-10-CM | POA: Diagnosis not present

## 2017-06-19 DIAGNOSIS — I10 Essential (primary) hypertension: Secondary | ICD-10-CM | POA: Diagnosis not present

## 2017-06-19 DIAGNOSIS — I361 Nonrheumatic tricuspid (valve) insufficiency: Secondary | ICD-10-CM | POA: Diagnosis not present

## 2017-06-19 DIAGNOSIS — E119 Type 2 diabetes mellitus without complications: Secondary | ICD-10-CM | POA: Diagnosis not present

## 2017-06-19 DIAGNOSIS — E785 Hyperlipidemia, unspecified: Secondary | ICD-10-CM | POA: Diagnosis not present

## 2017-06-19 DIAGNOSIS — R261 Paralytic gait: Secondary | ICD-10-CM | POA: Diagnosis not present

## 2017-06-19 DIAGNOSIS — Z8673 Personal history of transient ischemic attack (TIA), and cerebral infarction without residual deficits: Secondary | ICD-10-CM | POA: Diagnosis not present

## 2017-06-19 DIAGNOSIS — R1312 Dysphagia, oropharyngeal phase: Secondary | ICD-10-CM | POA: Diagnosis not present

## 2017-06-19 DIAGNOSIS — G8194 Hemiplegia, unspecified affecting left nondominant side: Secondary | ICD-10-CM | POA: Diagnosis not present

## 2017-06-19 DIAGNOSIS — R443 Hallucinations, unspecified: Secondary | ICD-10-CM | POA: Diagnosis not present

## 2017-06-19 DIAGNOSIS — R5383 Other fatigue: Secondary | ICD-10-CM | POA: Diagnosis not present

## 2017-06-19 DIAGNOSIS — R69 Illness, unspecified: Secondary | ICD-10-CM | POA: Diagnosis not present

## 2017-06-19 DIAGNOSIS — Z7409 Other reduced mobility: Secondary | ICD-10-CM | POA: Diagnosis not present

## 2017-06-19 DIAGNOSIS — Z7902 Long term (current) use of antithrombotics/antiplatelets: Secondary | ICD-10-CM | POA: Diagnosis not present

## 2017-06-19 DIAGNOSIS — Z880 Allergy status to penicillin: Secondary | ICD-10-CM | POA: Diagnosis not present

## 2017-06-19 DIAGNOSIS — M6281 Muscle weakness (generalized): Secondary | ICD-10-CM | POA: Diagnosis not present

## 2017-06-19 DIAGNOSIS — K219 Gastro-esophageal reflux disease without esophagitis: Secondary | ICD-10-CM | POA: Diagnosis not present

## 2017-06-19 DIAGNOSIS — G464 Cerebellar stroke syndrome: Secondary | ICD-10-CM | POA: Diagnosis not present

## 2017-06-19 DIAGNOSIS — I351 Nonrheumatic aortic (valve) insufficiency: Secondary | ICD-10-CM | POA: Diagnosis not present

## 2017-06-19 DIAGNOSIS — R4182 Altered mental status, unspecified: Secondary | ICD-10-CM | POA: Diagnosis not present

## 2017-06-19 DIAGNOSIS — Z7982 Long term (current) use of aspirin: Secondary | ICD-10-CM | POA: Diagnosis not present

## 2017-06-19 DIAGNOSIS — Z9181 History of falling: Secondary | ICD-10-CM | POA: Diagnosis not present

## 2017-06-19 DIAGNOSIS — I69354 Hemiplegia and hemiparesis following cerebral infarction affecting left non-dominant side: Secondary | ICD-10-CM | POA: Diagnosis not present

## 2017-06-19 DIAGNOSIS — I6350 Cerebral infarction due to unspecified occlusion or stenosis of unspecified cerebral artery: Secondary | ICD-10-CM | POA: Diagnosis not present

## 2017-06-19 DIAGNOSIS — R279 Unspecified lack of coordination: Secondary | ICD-10-CM | POA: Diagnosis not present

## 2017-06-20 DIAGNOSIS — Z8673 Personal history of transient ischemic attack (TIA), and cerebral infarction without residual deficits: Secondary | ICD-10-CM | POA: Diagnosis not present

## 2017-06-20 DIAGNOSIS — R69 Illness, unspecified: Secondary | ICD-10-CM | POA: Diagnosis not present

## 2017-06-20 DIAGNOSIS — I10 Essential (primary) hypertension: Secondary | ICD-10-CM | POA: Diagnosis not present

## 2017-06-20 DIAGNOSIS — K219 Gastro-esophageal reflux disease without esophagitis: Secondary | ICD-10-CM | POA: Diagnosis not present

## 2017-06-20 DIAGNOSIS — R5383 Other fatigue: Secondary | ICD-10-CM | POA: Diagnosis not present

## 2017-06-24 DIAGNOSIS — I6350 Cerebral infarction due to unspecified occlusion or stenosis of unspecified cerebral artery: Secondary | ICD-10-CM | POA: Diagnosis not present

## 2017-06-24 DIAGNOSIS — I440 Atrioventricular block, first degree: Secondary | ICD-10-CM | POA: Diagnosis not present

## 2017-06-24 DIAGNOSIS — R5383 Other fatigue: Secondary | ICD-10-CM | POA: Diagnosis not present

## 2017-06-24 DIAGNOSIS — J690 Pneumonitis due to inhalation of food and vomit: Secondary | ICD-10-CM | POA: Diagnosis not present

## 2017-06-24 DIAGNOSIS — R531 Weakness: Secondary | ICD-10-CM | POA: Diagnosis not present

## 2017-06-24 DIAGNOSIS — Z8673 Personal history of transient ischemic attack (TIA), and cerebral infarction without residual deficits: Secondary | ICD-10-CM | POA: Diagnosis not present

## 2017-06-24 DIAGNOSIS — Z7982 Long term (current) use of aspirin: Secondary | ICD-10-CM | POA: Diagnosis not present

## 2017-06-24 DIAGNOSIS — R261 Paralytic gait: Secondary | ICD-10-CM | POA: Diagnosis not present

## 2017-06-24 DIAGNOSIS — R279 Unspecified lack of coordination: Secondary | ICD-10-CM | POA: Diagnosis not present

## 2017-06-24 DIAGNOSIS — G8194 Hemiplegia, unspecified affecting left nondominant side: Secondary | ICD-10-CM | POA: Diagnosis not present

## 2017-06-24 DIAGNOSIS — R69 Illness, unspecified: Secondary | ICD-10-CM | POA: Diagnosis not present

## 2017-06-24 DIAGNOSIS — R6889 Other general symptoms and signs: Secondary | ICD-10-CM | POA: Diagnosis not present

## 2017-06-24 DIAGNOSIS — R402441 Other coma, without documented Glasgow coma scale score, or with partial score reported, in the field [EMT or ambulance]: Secondary | ICD-10-CM | POA: Diagnosis not present

## 2017-06-24 DIAGNOSIS — G464 Cerebellar stroke syndrome: Secondary | ICD-10-CM | POA: Diagnosis not present

## 2017-06-24 DIAGNOSIS — J90 Pleural effusion, not elsewhere classified: Secondary | ICD-10-CM | POA: Diagnosis not present

## 2017-06-24 DIAGNOSIS — I69354 Hemiplegia and hemiparesis following cerebral infarction affecting left non-dominant side: Secondary | ICD-10-CM | POA: Diagnosis not present

## 2017-06-24 DIAGNOSIS — E785 Hyperlipidemia, unspecified: Secondary | ICD-10-CM | POA: Diagnosis not present

## 2017-06-24 DIAGNOSIS — R404 Transient alteration of awareness: Secondary | ICD-10-CM | POA: Diagnosis not present

## 2017-06-24 DIAGNOSIS — R9431 Abnormal electrocardiogram [ECG] [EKG]: Secondary | ICD-10-CM | POA: Diagnosis not present

## 2017-06-24 DIAGNOSIS — K219 Gastro-esophageal reflux disease without esophagitis: Secondary | ICD-10-CM | POA: Diagnosis not present

## 2017-06-24 DIAGNOSIS — R400 Somnolence: Secondary | ICD-10-CM | POA: Diagnosis not present

## 2017-06-24 DIAGNOSIS — Z9181 History of falling: Secondary | ICD-10-CM | POA: Diagnosis not present

## 2017-06-24 DIAGNOSIS — I639 Cerebral infarction, unspecified: Secondary | ICD-10-CM | POA: Diagnosis not present

## 2017-06-24 DIAGNOSIS — N390 Urinary tract infection, site not specified: Secondary | ICD-10-CM | POA: Diagnosis not present

## 2017-06-24 DIAGNOSIS — Z880 Allergy status to penicillin: Secondary | ICD-10-CM | POA: Diagnosis not present

## 2017-06-24 DIAGNOSIS — Z66 Do not resuscitate: Secondary | ICD-10-CM | POA: Diagnosis not present

## 2017-06-24 DIAGNOSIS — R4182 Altered mental status, unspecified: Secondary | ICD-10-CM | POA: Diagnosis not present

## 2017-06-24 DIAGNOSIS — I6389 Other cerebral infarction: Secondary | ICD-10-CM | POA: Diagnosis not present

## 2017-06-24 DIAGNOSIS — R2689 Other abnormalities of gait and mobility: Secondary | ICD-10-CM | POA: Diagnosis not present

## 2017-06-24 DIAGNOSIS — D72829 Elevated white blood cell count, unspecified: Secondary | ICD-10-CM | POA: Diagnosis not present

## 2017-06-24 DIAGNOSIS — R443 Hallucinations, unspecified: Secondary | ICD-10-CM | POA: Diagnosis not present

## 2017-06-24 DIAGNOSIS — M6281 Muscle weakness (generalized): Secondary | ICD-10-CM | POA: Diagnosis not present

## 2017-06-24 DIAGNOSIS — I10 Essential (primary) hypertension: Secondary | ICD-10-CM | POA: Diagnosis not present

## 2017-06-24 DIAGNOSIS — B952 Enterococcus as the cause of diseases classified elsewhere: Secondary | ICD-10-CM | POA: Diagnosis not present

## 2017-06-25 DIAGNOSIS — R400 Somnolence: Secondary | ICD-10-CM | POA: Diagnosis not present

## 2017-06-25 DIAGNOSIS — R4182 Altered mental status, unspecified: Secondary | ICD-10-CM | POA: Diagnosis not present

## 2017-06-25 DIAGNOSIS — R531 Weakness: Secondary | ICD-10-CM | POA: Diagnosis not present

## 2017-06-28 DIAGNOSIS — R261 Paralytic gait: Secondary | ICD-10-CM | POA: Diagnosis not present

## 2017-06-28 DIAGNOSIS — B952 Enterococcus as the cause of diseases classified elsewhere: Secondary | ICD-10-CM | POA: Diagnosis not present

## 2017-06-28 DIAGNOSIS — R269 Unspecified abnormalities of gait and mobility: Secondary | ICD-10-CM | POA: Diagnosis not present

## 2017-06-28 DIAGNOSIS — R9401 Abnormal electroencephalogram [EEG]: Secondary | ICD-10-CM | POA: Diagnosis not present

## 2017-06-28 DIAGNOSIS — R402441 Other coma, without documented Glasgow coma scale score, or with partial score reported, in the field [EMT or ambulance]: Secondary | ICD-10-CM | POA: Diagnosis not present

## 2017-06-28 DIAGNOSIS — I10 Essential (primary) hypertension: Secondary | ICD-10-CM | POA: Diagnosis not present

## 2017-06-28 DIAGNOSIS — M6281 Muscle weakness (generalized): Secondary | ICD-10-CM | POA: Diagnosis not present

## 2017-06-28 DIAGNOSIS — K219 Gastro-esophageal reflux disease without esophagitis: Secondary | ICD-10-CM | POA: Diagnosis not present

## 2017-06-28 DIAGNOSIS — J90 Pleural effusion, not elsewhere classified: Secondary | ICD-10-CM | POA: Diagnosis not present

## 2017-06-28 DIAGNOSIS — Z880 Allergy status to penicillin: Secondary | ICD-10-CM | POA: Diagnosis not present

## 2017-06-28 DIAGNOSIS — I440 Atrioventricular block, first degree: Secondary | ICD-10-CM | POA: Diagnosis not present

## 2017-06-28 DIAGNOSIS — R400 Somnolence: Secondary | ICD-10-CM | POA: Diagnosis not present

## 2017-06-28 DIAGNOSIS — G464 Cerebellar stroke syndrome: Secondary | ICD-10-CM | POA: Diagnosis not present

## 2017-06-28 DIAGNOSIS — R2689 Other abnormalities of gait and mobility: Secondary | ICD-10-CM | POA: Diagnosis not present

## 2017-06-28 DIAGNOSIS — R1310 Dysphagia, unspecified: Secondary | ICD-10-CM | POA: Diagnosis not present

## 2017-06-28 DIAGNOSIS — Z7982 Long term (current) use of aspirin: Secondary | ICD-10-CM | POA: Diagnosis not present

## 2017-06-28 DIAGNOSIS — R4182 Altered mental status, unspecified: Secondary | ICD-10-CM | POA: Diagnosis not present

## 2017-06-28 DIAGNOSIS — R464 Slowness and poor responsiveness: Secondary | ICD-10-CM | POA: Diagnosis not present

## 2017-06-28 DIAGNOSIS — D72829 Elevated white blood cell count, unspecified: Secondary | ICD-10-CM | POA: Diagnosis not present

## 2017-06-28 DIAGNOSIS — R279 Unspecified lack of coordination: Secondary | ICD-10-CM | POA: Diagnosis not present

## 2017-06-28 DIAGNOSIS — N390 Urinary tract infection, site not specified: Secondary | ICD-10-CM | POA: Diagnosis not present

## 2017-06-28 DIAGNOSIS — I639 Cerebral infarction, unspecified: Secondary | ICD-10-CM | POA: Diagnosis not present

## 2017-06-28 DIAGNOSIS — R443 Hallucinations, unspecified: Secondary | ICD-10-CM | POA: Diagnosis not present

## 2017-06-28 DIAGNOSIS — I618 Other nontraumatic intracerebral hemorrhage: Secondary | ICD-10-CM | POA: Diagnosis not present

## 2017-06-28 DIAGNOSIS — R9431 Abnormal electrocardiogram [ECG] [EKG]: Secondary | ICD-10-CM | POA: Diagnosis not present

## 2017-06-28 DIAGNOSIS — F0390 Unspecified dementia without behavioral disturbance: Secondary | ICD-10-CM | POA: Diagnosis not present

## 2017-06-28 DIAGNOSIS — Z8673 Personal history of transient ischemic attack (TIA), and cerebral infarction without residual deficits: Secondary | ICD-10-CM | POA: Diagnosis not present

## 2017-06-28 DIAGNOSIS — G8194 Hemiplegia, unspecified affecting left nondominant side: Secondary | ICD-10-CM | POA: Diagnosis not present

## 2017-06-28 DIAGNOSIS — J690 Pneumonitis due to inhalation of food and vomit: Secondary | ICD-10-CM | POA: Diagnosis not present

## 2017-06-28 DIAGNOSIS — I69119 Unspecified symptoms and signs involving cognitive functions following nontraumatic intracerebral hemorrhage: Secondary | ICD-10-CM | POA: Diagnosis not present

## 2017-06-28 DIAGNOSIS — R69 Illness, unspecified: Secondary | ICD-10-CM | POA: Diagnosis not present

## 2017-06-28 DIAGNOSIS — I6350 Cerebral infarction due to unspecified occlusion or stenosis of unspecified cerebral artery: Secondary | ICD-10-CM | POA: Diagnosis not present

## 2017-06-28 DIAGNOSIS — R531 Weakness: Secondary | ICD-10-CM | POA: Diagnosis not present

## 2017-06-28 DIAGNOSIS — Z7409 Other reduced mobility: Secondary | ICD-10-CM | POA: Diagnosis not present

## 2017-06-28 DIAGNOSIS — E785 Hyperlipidemia, unspecified: Secondary | ICD-10-CM | POA: Diagnosis not present

## 2017-06-28 DIAGNOSIS — Z515 Encounter for palliative care: Secondary | ICD-10-CM | POA: Diagnosis not present

## 2017-06-28 DIAGNOSIS — I6389 Other cerebral infarction: Secondary | ICD-10-CM | POA: Diagnosis not present

## 2017-06-28 DIAGNOSIS — R5381 Other malaise: Secondary | ICD-10-CM | POA: Diagnosis not present

## 2017-06-28 DIAGNOSIS — R339 Retention of urine, unspecified: Secondary | ICD-10-CM | POA: Diagnosis not present

## 2017-06-28 DIAGNOSIS — I69354 Hemiplegia and hemiparesis following cerebral infarction affecting left non-dominant side: Secondary | ICD-10-CM | POA: Diagnosis not present

## 2017-06-28 DIAGNOSIS — Z66 Do not resuscitate: Secondary | ICD-10-CM | POA: Diagnosis not present

## 2017-07-08 ENCOUNTER — Ambulatory Visit: Payer: Medicare HMO | Admitting: Internal Medicine

## 2017-07-09 DIAGNOSIS — Z7401 Bed confinement status: Secondary | ICD-10-CM | POA: Diagnosis not present

## 2017-07-09 DIAGNOSIS — E785 Hyperlipidemia, unspecified: Secondary | ICD-10-CM | POA: Diagnosis not present

## 2017-07-09 DIAGNOSIS — D649 Anemia, unspecified: Secondary | ICD-10-CM | POA: Diagnosis not present

## 2017-07-09 DIAGNOSIS — Z658 Other specified problems related to psychosocial circumstances: Secondary | ICD-10-CM | POA: Diagnosis not present

## 2017-07-09 DIAGNOSIS — G464 Cerebellar stroke syndrome: Secondary | ICD-10-CM | POA: Diagnosis not present

## 2017-07-09 DIAGNOSIS — J690 Pneumonitis due to inhalation of food and vomit: Secondary | ICD-10-CM | POA: Diagnosis not present

## 2017-07-09 DIAGNOSIS — K219 Gastro-esophageal reflux disease without esophagitis: Secondary | ICD-10-CM | POA: Diagnosis not present

## 2017-07-09 DIAGNOSIS — E119 Type 2 diabetes mellitus without complications: Secondary | ICD-10-CM | POA: Diagnosis not present

## 2017-07-09 DIAGNOSIS — R261 Paralytic gait: Secondary | ICD-10-CM | POA: Diagnosis not present

## 2017-07-09 DIAGNOSIS — R339 Retention of urine, unspecified: Secondary | ICD-10-CM | POA: Diagnosis not present

## 2017-07-09 DIAGNOSIS — E559 Vitamin D deficiency, unspecified: Secondary | ICD-10-CM | POA: Diagnosis not present

## 2017-07-09 DIAGNOSIS — I639 Cerebral infarction, unspecified: Secondary | ICD-10-CM | POA: Diagnosis not present

## 2017-07-09 DIAGNOSIS — I10 Essential (primary) hypertension: Secondary | ICD-10-CM | POA: Diagnosis not present

## 2017-07-09 DIAGNOSIS — D519 Vitamin B12 deficiency anemia, unspecified: Secondary | ICD-10-CM | POA: Diagnosis not present

## 2017-07-09 DIAGNOSIS — R443 Hallucinations, unspecified: Secondary | ICD-10-CM | POA: Diagnosis not present

## 2017-07-09 DIAGNOSIS — R69 Illness, unspecified: Secondary | ICD-10-CM | POA: Diagnosis not present

## 2017-07-09 DIAGNOSIS — Z7409 Other reduced mobility: Secondary | ICD-10-CM | POA: Diagnosis not present

## 2017-07-09 DIAGNOSIS — I6350 Cerebral infarction due to unspecified occlusion or stenosis of unspecified cerebral artery: Secondary | ICD-10-CM | POA: Diagnosis not present

## 2017-07-09 DIAGNOSIS — I69354 Hemiplegia and hemiparesis following cerebral infarction affecting left non-dominant side: Secondary | ICD-10-CM | POA: Diagnosis not present

## 2017-07-09 DIAGNOSIS — I69119 Unspecified symptoms and signs involving cognitive functions following nontraumatic intracerebral hemorrhage: Secondary | ICD-10-CM | POA: Diagnosis not present

## 2017-07-09 DIAGNOSIS — M6281 Muscle weakness (generalized): Secondary | ICD-10-CM | POA: Diagnosis not present

## 2017-07-09 DIAGNOSIS — E039 Hypothyroidism, unspecified: Secondary | ICD-10-CM | POA: Diagnosis not present

## 2017-07-09 DIAGNOSIS — R269 Unspecified abnormalities of gait and mobility: Secondary | ICD-10-CM | POA: Diagnosis not present

## 2017-07-09 DIAGNOSIS — R4182 Altered mental status, unspecified: Secondary | ICD-10-CM | POA: Diagnosis not present

## 2017-07-09 DIAGNOSIS — N390 Urinary tract infection, site not specified: Secondary | ICD-10-CM | POA: Diagnosis not present

## 2017-07-09 DIAGNOSIS — R5381 Other malaise: Secondary | ICD-10-CM | POA: Diagnosis not present

## 2017-07-09 DIAGNOSIS — Z79899 Other long term (current) drug therapy: Secondary | ICD-10-CM | POA: Diagnosis not present

## 2017-07-09 DIAGNOSIS — R279 Unspecified lack of coordination: Secondary | ICD-10-CM | POA: Diagnosis not present

## 2017-07-09 MED ORDER — ASPIRIN EC 81 MG PO TBEC
81.00 mg | DELAYED_RELEASE_TABLET | ORAL | Status: DC
Start: 2017-07-10 — End: 2017-07-09

## 2017-07-09 MED ORDER — NITROFURANTOIN MONOHYD MACRO 100 MG PO CAPS
100.00 mg | ORAL_CAPSULE | ORAL | Status: DC
Start: 2017-07-09 — End: 2017-07-09

## 2017-07-09 MED ORDER — DONEPEZIL HCL 10 MG PO TABS
10.00 mg | ORAL_TABLET | ORAL | Status: DC
Start: 2017-07-09 — End: 2017-07-09

## 2017-07-09 MED ORDER — ATORVASTATIN CALCIUM 40 MG PO TABS
40.00 mg | ORAL_TABLET | ORAL | Status: DC
Start: 2017-07-09 — End: 2017-07-09

## 2017-07-09 MED ORDER — QUETIAPINE FUMARATE ER 50 MG PO TB24
50.00 mg | ORAL_TABLET | ORAL | Status: DC
Start: 2017-07-10 — End: 2017-07-09

## 2017-07-09 MED ORDER — PANTOPRAZOLE 40 MG/20 ML SUSPENSION
40.00 mg | PACK | ORAL | Status: DC
Start: 2017-07-10 — End: 2017-07-09

## 2017-07-09 MED ORDER — CLOPIDOGREL BISULFATE 75 MG PO TABS
75.00 mg | ORAL_TABLET | ORAL | Status: DC
Start: 2017-07-10 — End: 2017-07-09

## 2017-07-09 MED ORDER — GENERIC EXTERNAL MEDICATION
2.00 | Status: DC
Start: 2017-07-09 — End: 2017-07-09

## 2017-07-09 MED ORDER — ENOXAPARIN SODIUM 30 MG/0.3ML ~~LOC~~ SOLN
30.00 mg | SUBCUTANEOUS | Status: DC
Start: 2017-07-09 — End: 2017-07-09

## 2017-07-19 ENCOUNTER — Telehealth: Payer: Self-pay | Admitting: Neurology

## 2017-07-19 NOTE — Telephone Encounter (Signed)
Pt scheduled - Carolyn notified.

## 2017-07-19 NOTE — Telephone Encounter (Signed)
Can put her in my EEG reading time 12/19 at 3:30pm, thanks

## 2017-07-19 NOTE — Telephone Encounter (Signed)
Erin Hubbard called from [facility] regarding this patient. She was just released on 11/16 from [facility] due to having another Stroke. Patient is scheduled for a follow up in March and also on a wait list. Should she be seen sooner? Please Advise. Thanks

## 2017-08-03 ENCOUNTER — Telehealth: Payer: Self-pay | Admitting: *Deleted

## 2017-08-03 NOTE — Telephone Encounter (Signed)
Grand daughter Curt Bears called and stated that patient had a stroke alittle over a month ago and cannot use the Left side. Stated she is [facility] discharged patient to [facility] in [place]. [place] daughter stated that the Rehab center is trying to send patient home stating that she does not qualify for skilled nursing. Patient cannot walk and has a Cath. And also had Aspirated Pneumonia. Cannot use left side of body.  They are appealing the decision and awaiting determination and speaking with an attorney for Medicaid benefits. [place] daughter is wanting to know if they release patient if you would be able to write orders for skilled nursing or if you have any suggestions. Please Advise.

## 2017-08-04 NOTE — Telephone Encounter (Signed)
I'm sorry to hear that she had another stroke.  I would have to see Erin Hubbard to make any kind of determination or write any orders.  Typically, the facility physician plus PT, OT (therapy department) there can determine if she needs/qualifies for skilled care.  There is little I can do at the [facility] should reach out to the facility physician.

## 2017-08-04 NOTE — Telephone Encounter (Signed)
Patient caregiver, [name] Notified. Had to leave message with Dr. Magdalene Molly response.

## 2017-08-05 NOTE — Telephone Encounter (Signed)
Spoke with patient's granddaughter and she did receive the message. However granddaughter [name] calling also to state that Erin Hubbard is trying to discharge her grandmother today with out any help or assistance. Per granddaughter she states pt has cath and need full transfers and a lot more help is needed. Granddaughter has contacted a Advertising account planner and he's trying to help sort some of these things out. Granddaughter did set up follow-up appt with Dr. Mariea Clonts on 08/30/17. I tried to assure granddaughter that the pt should have some sort of help when she comes home and surely they wouldn't release her with home health or something. granddaughter was heading to [place] when she got off the phone.

## 2017-08-06 NOTE — Telephone Encounter (Signed)
Spoke with granddaughter and pt is still in the facility, they filed an appeal and pt will remain there. Spoke with Dr. Mariea Clonts verbally.

## 2017-08-06 NOTE — Telephone Encounter (Signed)
Noted.  If no home health is arranged, we can certainly take care of that for her if necessary.  Hoping the facility providers will take care of it as would be the typical process.

## 2017-08-11 ENCOUNTER — Ambulatory Visit: Payer: Medicare HMO | Admitting: Neurology

## 2017-08-30 ENCOUNTER — Ambulatory Visit: Payer: Medicare HMO | Admitting: Internal Medicine

## 2017-09-01 DIAGNOSIS — Z658 Other specified problems related to psychosocial circumstances: Secondary | ICD-10-CM | POA: Diagnosis not present

## 2017-09-01 DIAGNOSIS — R69 Illness, unspecified: Secondary | ICD-10-CM | POA: Diagnosis not present

## 2017-09-10 DIAGNOSIS — Z7401 Bed confinement status: Secondary | ICD-10-CM | POA: Diagnosis not present

## 2017-09-10 DIAGNOSIS — N390 Urinary tract infection, site not specified: Secondary | ICD-10-CM | POA: Diagnosis not present

## 2017-09-10 DIAGNOSIS — R279 Unspecified lack of coordination: Secondary | ICD-10-CM | POA: Diagnosis not present

## 2017-09-10 DIAGNOSIS — R339 Retention of urine, unspecified: Secondary | ICD-10-CM | POA: Diagnosis not present

## 2017-09-13 DIAGNOSIS — R69 Illness, unspecified: Secondary | ICD-10-CM | POA: Diagnosis not present

## 2017-09-13 DIAGNOSIS — F0150 Vascular dementia without behavioral disturbance: Secondary | ICD-10-CM | POA: Diagnosis not present

## 2017-09-13 DIAGNOSIS — F419 Anxiety disorder, unspecified: Secondary | ICD-10-CM | POA: Diagnosis not present

## 2017-10-15 DIAGNOSIS — R69 Illness, unspecified: Secondary | ICD-10-CM | POA: Diagnosis not present

## 2017-10-25 ENCOUNTER — Ambulatory Visit (INDEPENDENT_AMBULATORY_CARE_PROVIDER_SITE_OTHER): Payer: Medicare HMO | Admitting: Neurology

## 2017-10-25 ENCOUNTER — Encounter: Payer: Self-pay | Admitting: Neurology

## 2017-10-25 VITALS — BP 180/74 | HR 74

## 2017-10-25 DIAGNOSIS — I6350 Cerebral infarction due to unspecified occlusion or stenosis of unspecified cerebral artery: Secondary | ICD-10-CM | POA: Diagnosis not present

## 2017-10-25 DIAGNOSIS — Z7401 Bed confinement status: Secondary | ICD-10-CM | POA: Diagnosis not present

## 2017-10-25 DIAGNOSIS — F01518 Vascular dementia, unspecified severity, with other behavioral disturbance: Secondary | ICD-10-CM

## 2017-10-25 DIAGNOSIS — R69 Illness, unspecified: Secondary | ICD-10-CM | POA: Diagnosis not present

## 2017-10-25 DIAGNOSIS — R279 Unspecified lack of coordination: Secondary | ICD-10-CM | POA: Diagnosis not present

## 2017-10-25 DIAGNOSIS — F0151 Vascular dementia with behavioral disturbance: Secondary | ICD-10-CM | POA: Diagnosis not present

## 2017-10-25 NOTE — Patient Instructions (Signed)
1. Continue all your medications 2. Please have staff evaluate left leg, patient grimaces with pain when leg is moved 3. Follow-up in 6 months, call for any changes

## 2017-10-25 NOTE — Progress Notes (Signed)
NEUROLOGY FOLLOW UP OFFICE NOTE  Erin Hubbard [id number] 14-Jan-1925  HISTORY OF PRESENT ILLNESS: I had the pleasure of seeing Erin Hubbard in follow-up in the neurology clinic on 10/25/2017.  The patient was last seen 6 months ago for vascular dementia with behavioral disturbance. She is accompanied by transport staff today, there is no family or SNF staff to provide any information. The patient reports she is feeling fine. She denies any pain, however when her left leg is examined, she grimaces and reports pain. She reports being bed-bound, she does not participate in PT. She denies any falls. She reports that meals are brought to her bedside. She denies any headaches, dizziness. Records on Epic were reviewed, she was admitted to [facility] in 06/2017 for altered mental status. She had a right pontine stroke in 05/2017 with normal echo and carotid doppler showing 40-59% bilateral stenosis. She was discharged to a SNF then taken to [facility] for [place] and treated for a UTI. Due to worsening mental status, she was transferred to [facility] where MRI showed a right paramedian early subacute infarct with mild interval increase with associated edema. Plavix was added to aspirin. She continues on Donepezil and Seroquel for behavioral changes associated with dementia.  HPI 04/13/2017: This is a pleasant 84 yo RH woman with a history of hypertension, hyperlipidemia, diabetes, right temporal intracranial bleed in 2013, left parotid tumor resection, vascular dementia. She had been going to [facility] neurology for many years but the practice has closed down, they are planning to return to her prior neurologist's care once office opens up again, but would like to have neurological care in the meantime. Records were reviewed. She was diagnosed with dementia around 2014. In June 2018, she started having spells of increased emotionality. She cried while doing PT for no clear reason, it appears she was  thinking she did wrong with her husband's burial 13 years prior. Her son was diagnosed with frontotemporal dementia at age 24, and this had been difficult for her. At that time, she denied any anxiety or depression. A few days later, she was brought to [facility] due to altered mental status. She was hallucinating, stating people told her things that were not true, being hysterical. She had an MRI brain 02/06/17 which showed a 110mm acute infarct in the left medial frontal lobe, mild chronic microvascular disease, chronic microhemorrhage in right frontal lobe and right occipital lobe. EEG was reported as normal. Her granddaughter reports that since the stroke, something has been off. She would have crying spells that became progressively worse, there were days she would cry almost all day. She became more agitated and tried to hit family when they touched her. She would get upset about going to appointments. She was having hallucinations, trying to get up and leave, at one point threw the dog, her cane, at family. She would cry, then laugh, then get quiet. She was not sleeping well. Her granddaughter reports she was having delusions of the devils taking her husband away and making her disappear. Her family brought her to the ER on 03/29/17 for aggressive behavior, paranoia, and psychotic features in a sundowning pattern. She had side effects on Seroquel (drugged, did not help with hallucinations) and was started on Depakote. She was admitted at [facility] until 04/06/17, then transferred to [facility] in [place]. Granddaughter reports Depakote was stopped in [place], she is unsure if there were side effects. She had Ativan which made her more hyper. She had seen  her PCP last 04/08/16 and was started on citalopram and prn buspirone. She has been doing better over the past week, she is calm in the office today. She has been staying with her sister. Her granddaughter states there has been a big improvement, she  takes her medications when administered to her. Appetite is good. One time she thought she had already eaten. She fell last Sunday when she got off balance and fell backwards, no injuries. She has occasional dizziness upon standing, she has occasional numbness in the last 3 fingers of her left hand. She has occasional swallowing difficulties and has been having home speech therapy per granddaughter. She denies any headaches, diplopia, neck/back pain, bowel/bladder dysfunction. Her family wonders if she sleeps well because she takes naps in the daytime, she does not wander at night. She can dress and bathe herself but family assists to ensure safety. Her granddaughter reports tremors since all these symptoms started, which is bothersome for the patient. She apparently was doing well with PT, until she started having some shortness of breath last July.  PAST MEDICAL HISTORY: Past Medical History:  Diagnosis Date  . 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 Confusion    04/19/14:EEG showed abnormal with mild to moderate slow wave over the left frontotemporal area and mild slow wave abnormality in the right temporal region without sz activity   . Diabetes mellitus without complication ([place])   . Essential hypertension, benign 02/20/2016  . Gait disturbance   . Gallbladder disorder   . History of recurrent UTI (urinary tract infection)   . Hyperlipidemia   . Hypotension   . Intracerebral hemorrhage ([place])   . Memory loss   . Stroke ([place])   . Vascular dementia without behavioral disturbance     MEDICATIONS: Current Outpatient Medications on File Prior to Visit  Medication Sig Dispense Refill  . acetaminophen (TYLENOL) 500 MG tablet Take 1 tablet (500 mg total) by mouth daily. For possible pain 30 tablet 0  . amLODipine (NORVASC) 5 MG tablet Take 1 tablet (5 mg total) by mouth daily. 30 tablet 3  . Artificial Tear Ointment (LUBRICANT EYE) OINT Apply to eye at bedtime.    . busPIRone  (BUSPAR) 5 MG tablet Take 1 tablet (5 mg total) by mouth daily as needed (anxiety). 15 tablet 0  . citalopram (CELEXA) 10 MG tablet Take 1 tablet (10 mg total) by mouth daily. 30 tablet 3  . clopidogrel (PLAVIX) 75 MG tablet Take 1 tablet (75 mg total) by mouth daily. Patient needs an appointment before anymore refills 90 tablet 0  . clopidogrel (PLAVIX) 75 MG tablet TAKE 1 TABLET BY MOUTH EVERY DAY 90 tablet 2  . donepezil (ARICEPT) 5 MG tablet Take 1 tablet (5 mg total) by mouth at bedtime. 90 tablet 1  . glucose blood (ONE TOUCH ULTRA TEST) test strip Use as instructed to test blood sugar once daily DX: E11.49 100 each 1  . levofloxacin (LEVAQUIN) 500 MG tablet Take 1 tablet (500 mg total) by mouth daily. 7 tablet 0  . ONETOUCH DELICA LANCETS 65H MISC Use as instructed to test blood sugar once daily DX: E11.49 100 each 1  . pantoprazole (PROTONIX) 40 MG tablet TAKE 1 TABLET BY MOUTH 2 TIMES A DAY WITH FOOD FOR STOMACH 180 tablet 1  . Polyethyl Glycol-Propyl Glycol 0.4-0.3 % SOLN Apply to eye.    Marland Kitchen QUEtiapine (SEROQUEL) 50 MG tablet Take 1 tablet (50 mg total) by mouth at  bedtime. 30 tablet 3   No current facility-administered medications on file prior to visit.     ALLERGIES: Allergies  Allergen Reactions  . Penicillins     FAMILY HISTORY: Family History  Problem Relation Age of Onset  . Heart disease Mother   . Heart disease Father   . High blood pressure Sister   . Heart disease Sister   . High Cholesterol Sister   . Diabetes Daughter   . Polymyositis Daughter   . Stroke Other        aunt  . Dementia Son        Fontal Temporal Dementia (FTD)   . High blood pressure Son   . Thyroid disease Grandchild        Autoimmune thyroid condition     SOCIAL HISTORY: Social History   Socioeconomic History  . Marital status: Widowed    Spouse name: Not on file  . Number of children: Not on file  . Years of education: Not on file  . Highest education level: Not on file  Social  Needs  . Financial resource strain: Not on file  . Food insecurity - worry: Not on file  . Food insecurity - inability: Not on file  . Transportation needs - medical: Not on file  . Transportation needs - non-medical: Not on file  Occupational History  . Not on file  Tobacco Use  . Smoking status: Never Smoker  . Smokeless tobacco: Never Used  Substance and Sexual Activity  . Alcohol use: No    Alcohol/week: 0.0 oz  . Drug use: No  . Sexual activity: Not on file  Other Topics Concern  . Not on file  Social History Narrative   Pt. Lives in a one story house, with four people, one large dog   Current/pass profession- paralegal   Pt. Does not exercise   Lives in house with her granddaughter who is her 24 hr caregiver   4 people total live in her home   Has a large dog   Has hcpoa, but no living will or dnr form at this time when establishing    REVIEW OF SYSTEMS: Constitutional: No fevers, chills, or sweats, no generalized fatigue, change in appetite Eyes: No visual changes, double vision, eye pain Ear, nose and throat: No hearing loss, ear pain, nasal congestion, sore throat Cardiovascular: No chest pain, palpitations Respiratory:  No shortness of breath at rest or with exertion, wheezes GastrointestinaI: No nausea, vomiting, diarrhea, abdominal pain, fecal incontinence Genitourinary:  No dysuria, urinary retention or frequency Musculoskeletal:  No neck pain, back pain Integumentary: No rash, pruritus, skin lesions Neurological: as above Psychiatric: No depression, insomnia, anxiety Endocrine: No palpitations, fatigue, diaphoresis, mood swings, change in appetite, change in weight, increased thirst Hematologic/Lymphatic:  No anemia, purpura, petechiae. Allergic/Immunologic: no itchy/runny eyes, nasal congestion, recent allergic reactions, rashes  PHYSICAL EXAM: Vitals:   10/25/17 1033  BP: (!) 180/74  Pulse: 74  SpO2: 93%   General: No acute distress, lying on  stretcher Head:  Normocephalic/atraumatic Neck: supple, no paraspinal tenderness, full range of motion Heart:  Regular rate and rhythm Lungs:  Clear to auscultation bilaterally Back: No paraspinal tenderness Skin/Extremities: No rash, no edema Neurological Exam: alert and oriented to person, place, month. No aphasia or dysarthria. Fund of knowledge is reduced. Recent and remote memory are impaired. 0/3 delayed recall.  Attention and concentration are normal.    Able to name objects and repeat phrases.  Cranial nerves: CN I: not tested  CN II: pupils equal, round and reactive to light, visual fields intact CN III, IV, VI:  full range of motion, no nystagmus, no ptosis CN V: facial sensation intact CN VII: left peripheral facial palsy (chronic from parotid surgery) with some synkinesis (occasional twitching) - similar to prior CN VIII: hearing intact to finger rub CN IX, X: gag intact, uvula midline CN XI: sternocleidomastoid and trapezius muscles intact CN XII: tongue midline Bulk & Tone: increased on left LE with foot in extensor posture, no cogwheeling, no fasciculations. Motor: 4/5 on left UE, 5/5 right UE, at least 3/5 on both LE (left leg weaker than right), pain with passive movement of left leg Sensation: intact to light touch Deep Tendon Reflexes: +1 throughout, no ankle clonus Plantar responses: upgoing on left, downgoing on right Cerebellar: no incoordination on finger to nose on right, hemiparetic ataxia on left UE Gait: not tested Tremor: none in office today  IMPRESSION: This is a 84 yo RH woman with a history of hypertension, hyperlipidemia, diabetes, right temporal intracranial bleed in 2013, left parotid tumor resection, vascular dementia, with another stroke in the right pontine region last October/November 2018. She is now bed-bound with left-sided weakness. There is no staff or family present today to provide additional information, history is obtained from [facility] records.  Patient reports she is doing fine, but is noted to have grimacing and pain on passive left leg movement. Please evaluate with PCP. Continue current medications for secondary stroke prevention and vascular dementia. She is on Aricept and Seroquel. Continue 24/7 care. She will follow-up in 6 months and knows to call for any changes.   Thank you for allowing me to participate in her care.  Please do not hesitate to call for any questions or concerns.  The duration of this appointment visit was 25 minutes of face-to-face time with the patient.  Greater than 50% of this time was spent in counseling, explanation of diagnosis, planning of further management, and coordination of care.   Ellouise Newer, M.D.   CC: Dr. Mariea Clonts

## 2017-11-19 DIAGNOSIS — F0150 Vascular dementia without behavioral disturbance: Secondary | ICD-10-CM | POA: Diagnosis not present

## 2017-11-19 DIAGNOSIS — F419 Anxiety disorder, unspecified: Secondary | ICD-10-CM | POA: Diagnosis not present

## 2017-11-19 DIAGNOSIS — R69 Illness, unspecified: Secondary | ICD-10-CM | POA: Diagnosis not present

## 2018-01-21 ENCOUNTER — Telehealth: Payer: Self-pay | Admitting: Internal Medicine

## 2018-01-21 NOTE — Telephone Encounter (Signed)
I'm sorry to hear this.  Thank you for the update. 

## 2018-01-21 NOTE — Telephone Encounter (Signed)
I spoke with the patient's granddaguhter for an update on the patient and to ask if she is still being seen at the nursing facility.  She told me that the patient passed away on 01-07-2018. VDM (DD)

## 2018-01-22 DEATH — deceased

## 2018-04-30 IMAGING — DX DG CHEST 2V
2 series · 2 of 2 positions shown · non-contrast
Comparison: None.

CLINICAL DATA: Shortness of breath for 2 weeks.

EXAM:
CHEST  2 VIEW

[dg chest 2 view (1 of 2)]
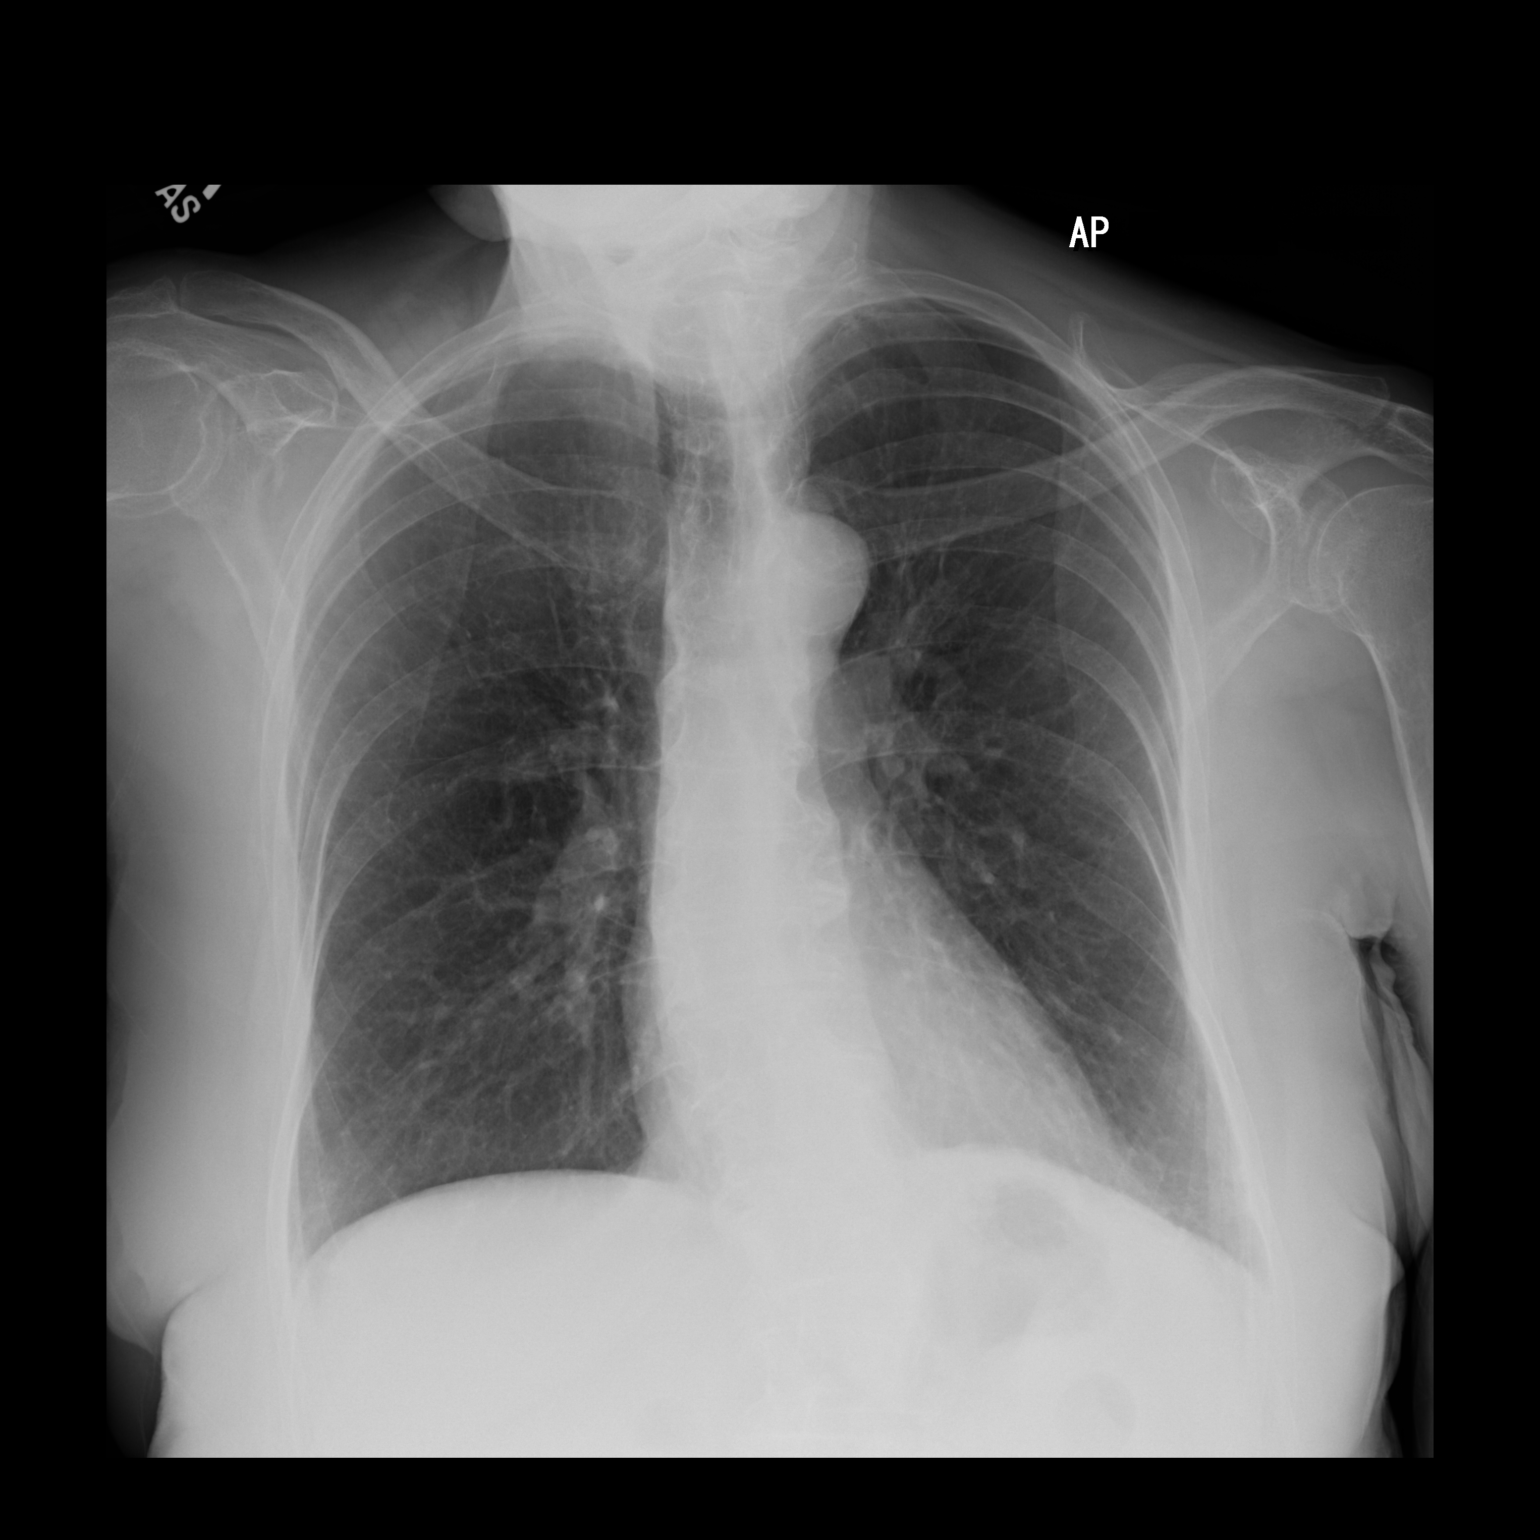

[dg chest 2 view (2 of 2)]
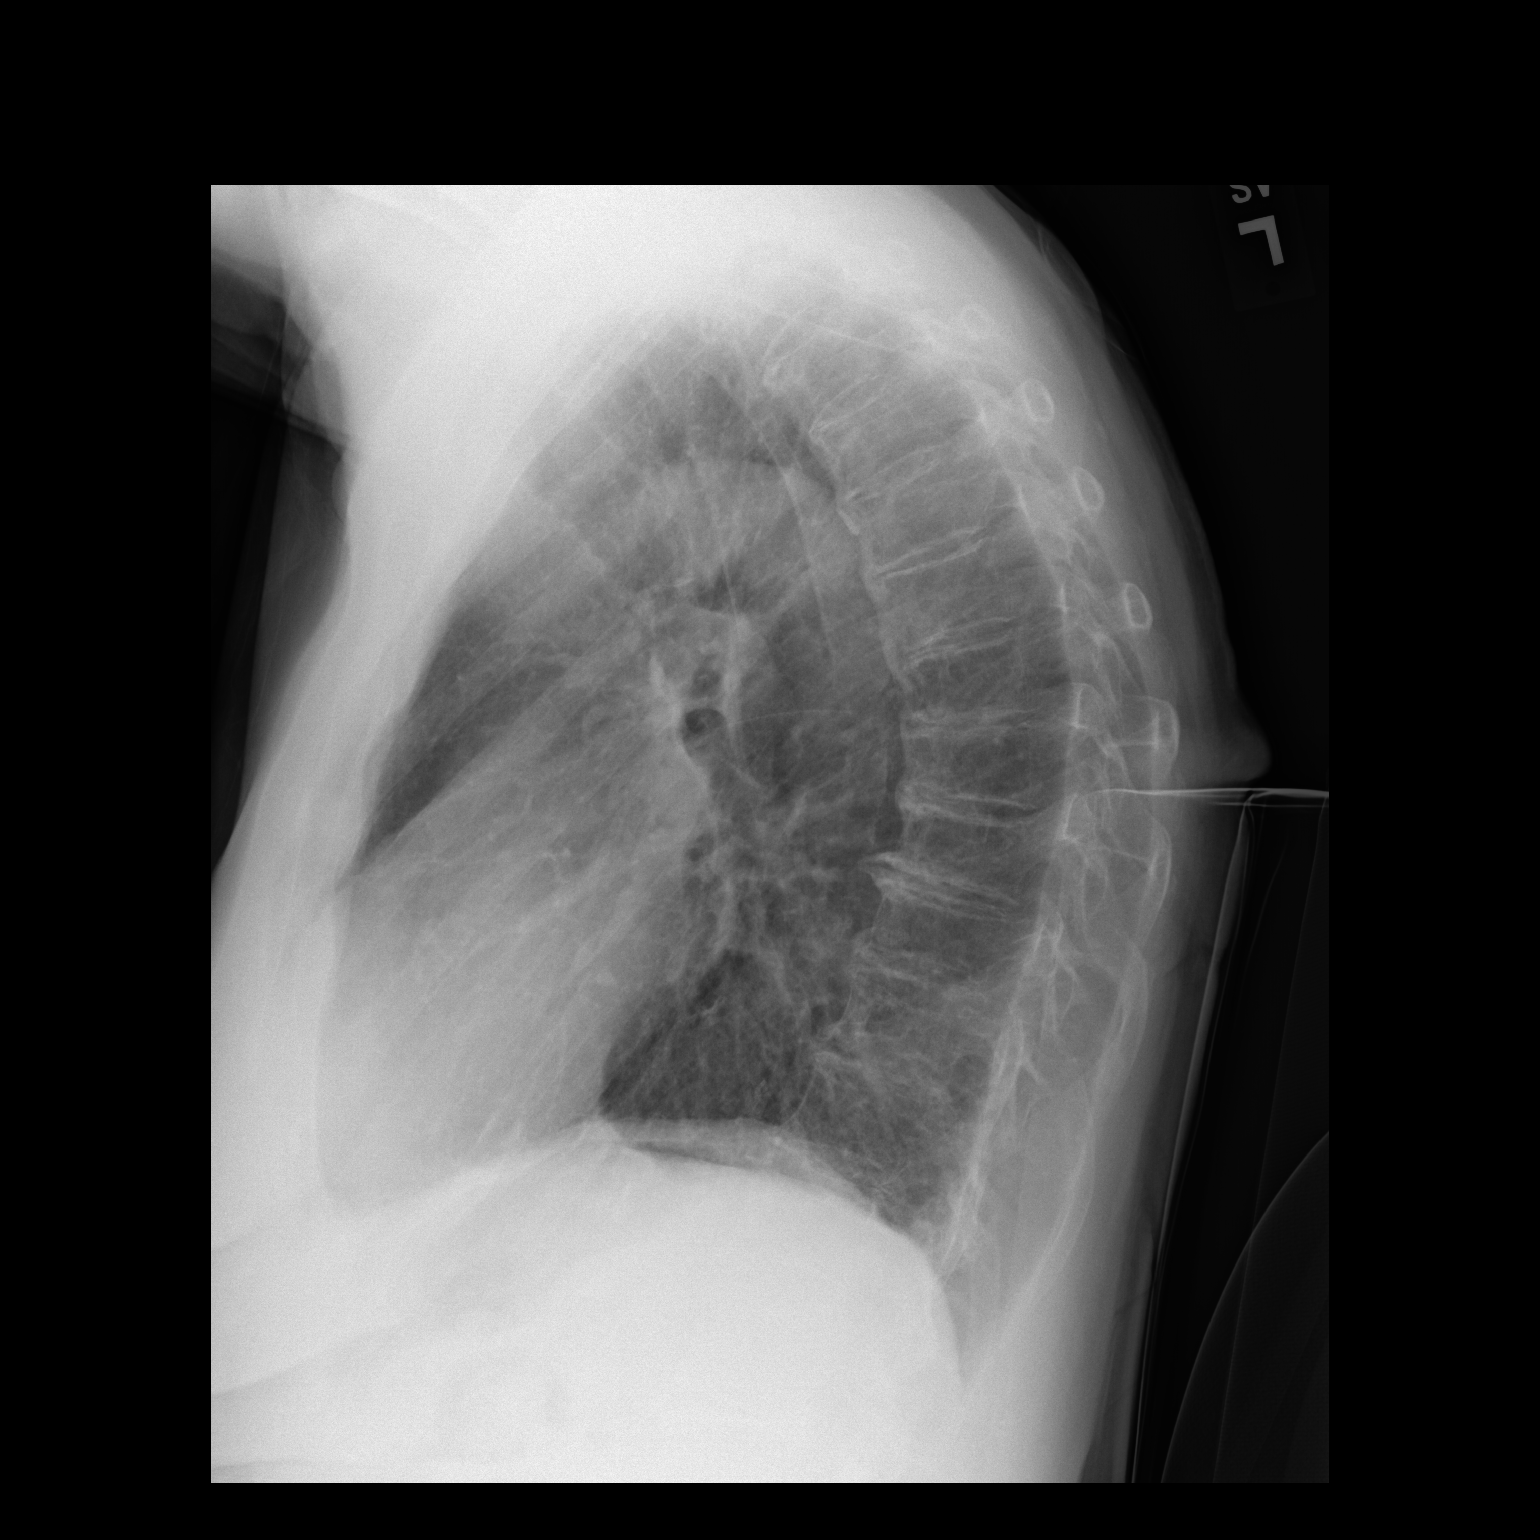

[2 of 2 positions shown; findings below may reference images not displayed]

FINDINGS: The cardiomediastinal silhouette is unremarkable.

There is no evidence of focal airspace disease, pulmonary edema,
suspicious pulmonary nodule/mass, pleural effusion, or pneumothorax.
No acute bony abnormalities are identified.
IMPRESSION: No active cardiopulmonary disease.

## 2018-06-19 IMAGING — DX DG CHEST 2V
2 series · 2 of 2 positions shown · non-contrast
Comparison: Chest radiograph 03/17/2017

CLINICAL DATA: Hallucination and blood in urine.

EXAM:
CHEST  2 VIEW

[chest pa]
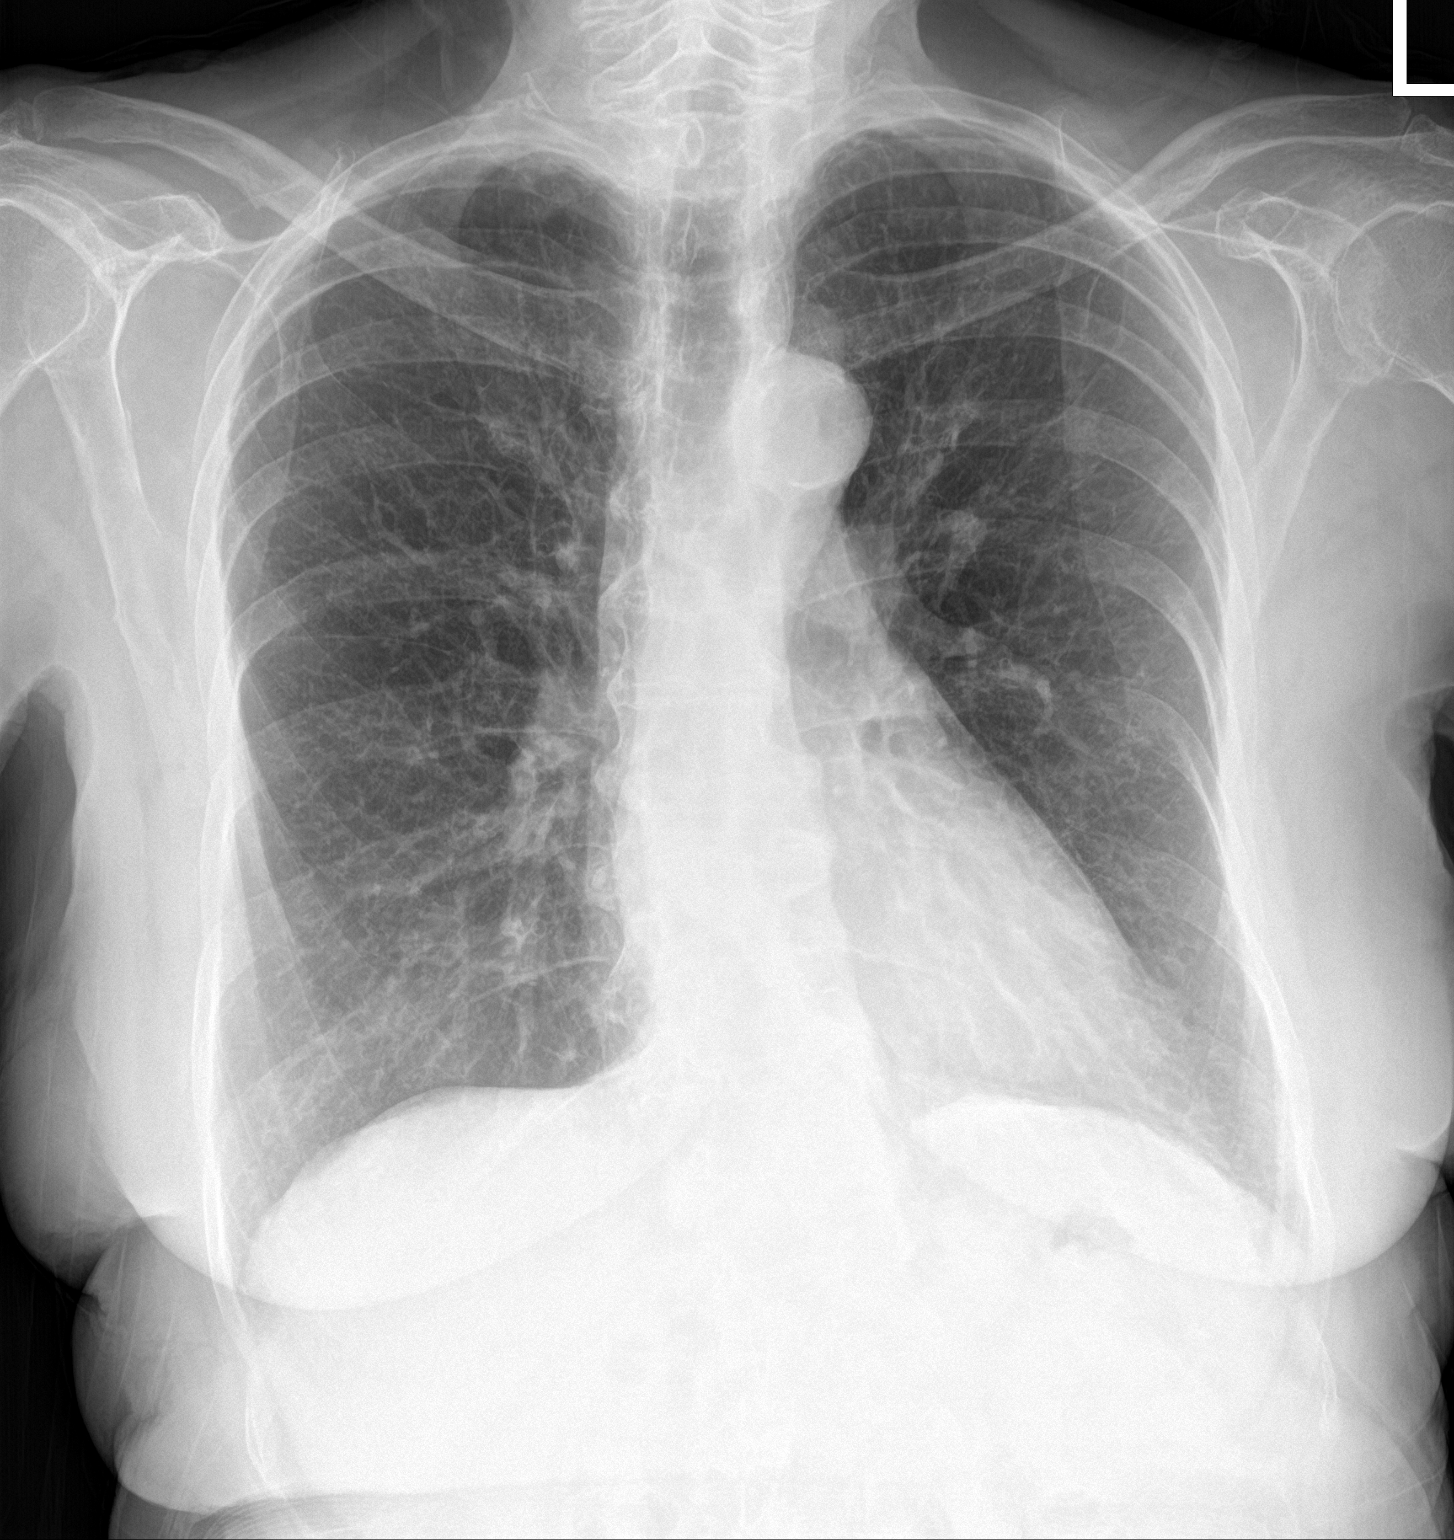

[chest lat]
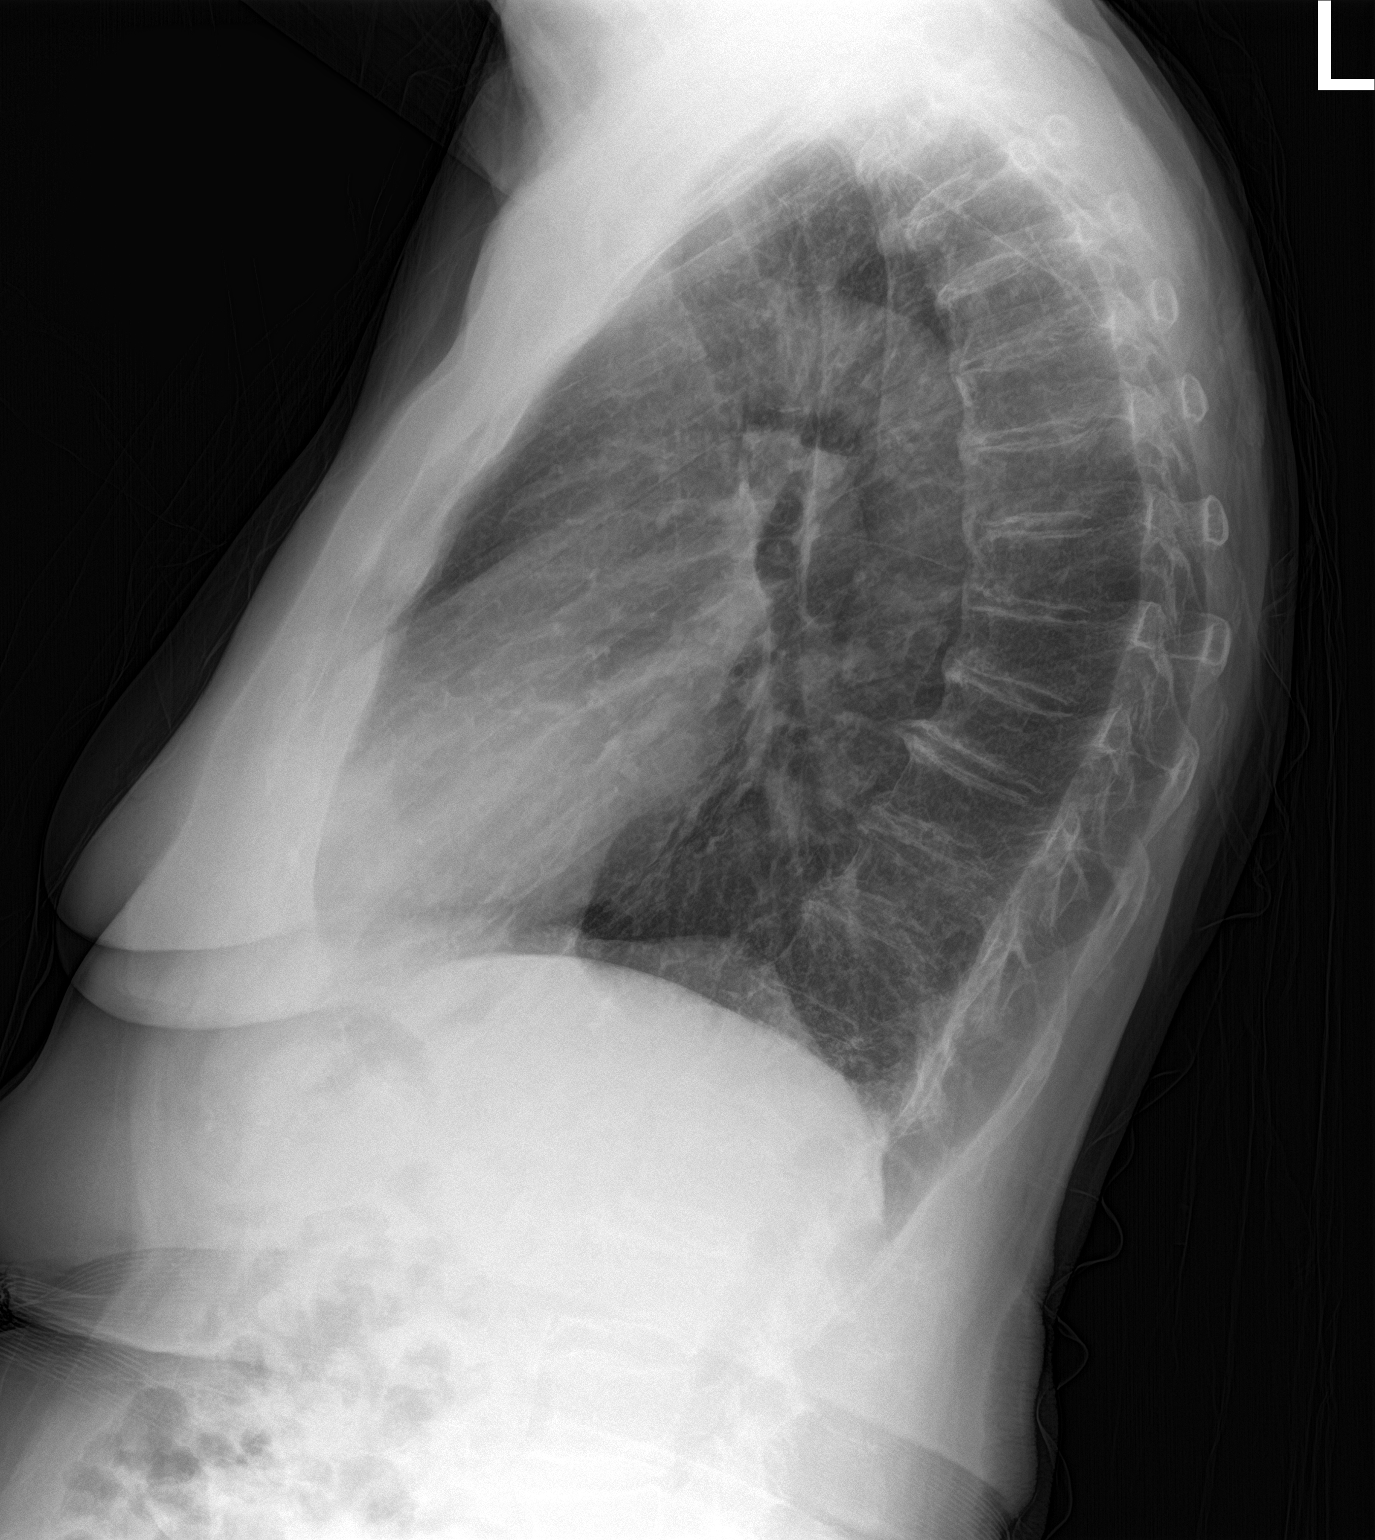

[2 of 2 positions shown; findings below may reference images not displayed]

FINDINGS: The cardiomediastinal contours are normal. Atherosclerosis of the
aortic arch. Pulmonary vasculature is normal. No consolidation,
pleural effusion, or pneumothorax. Mild broad-based rightward
curvature of the thoracic spine. No acute osseous abnormalities are
seen.
IMPRESSION: No acute pulmonary process.
